# Patient Record
Sex: Male | Born: 1990 | Race: Black or African American | Hispanic: No | Marital: Single | Smoking: Current every day smoker
Health system: Southern US, Community
[De-identification: ages and names within clinical notes are randomized; demographics above are authoritative.]

---
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
## 2016-06-22 ENCOUNTER — Ambulatory Visit (HOSPITAL_COMMUNITY)
Admission: EM | Admit: 2016-06-22 | Discharge: 2016-06-22 | Disposition: A | Discharge status: Home / Self Care | Payer: Self-pay | Attending: Internal Medicine | Admitting: Internal Medicine

## 2016-06-22 ENCOUNTER — Encounter (HOSPITAL_COMMUNITY): Payer: Self-pay | Admitting: *Deleted

## 2016-06-22 DIAGNOSIS — K640 First degree hemorrhoids: Secondary | ICD-10-CM

## 2016-06-22 MED ORDER — HYDROCORTISONE 2.5 % RE CREA: TOPICAL_CREAM | RECTAL | 1 refills | Status: DC

## 2016-06-22 MED ORDER — HYDROCORTISONE ACETATE 25 MG RE SUPP: 25.0000 mg | Freq: Two times a day (BID) | RECTAL | 0 refills | Status: DC

## 2016-06-22 MED ORDER — LIDOCAINE-HYDROCORTISONE ACE 3-0.5 % RE CREA: 1.0000 | TOPICAL_CREAM | Freq: Two times a day (BID) | RECTAL | 3 refills | Status: DC

## 2016-06-22 MED ORDER — HYDROCORTISONE ACETATE 25 MG RE SUPP: 25.0000 mg | Freq: Two times a day (BID) | RECTAL | 1 refills | Status: DC

## 2016-06-22 NOTE — ED Triage Notes (Signed)
Pt  Reports  Rectal  Pain  With  Some  Bleeding  No  History  Of  hemmorhiods     Pt  Reports  Pain   And  Pressure  On palpation  And  When  He  Sits

## 2016-06-22 NOTE — ED Notes (Addendum)
Pts mother called stating he anusol suppositories and cream was too expensive and wanted something different. Said we should of known he didn't have insurance. Per bill he could use prep H. suppositories otc and I did find that [facility] has the ointment for 9.99 and I relayed to the pt and bill and [facility]. Pts mother understood but was still verbally upset

## 2016-06-22 NOTE — ED Triage Notes (Signed)
Pt Reports

## 2016-06-22 NOTE — ED Provider Notes (Signed)
CSN: [id number]     Arrival date & time 06/22/16  1812 History   First MD Initiated Contact with Patient 06/22/16 1840     Chief Complaint  Patient presents with  . Rectal Bleeding   (Consider location/radiation/quality/duration/timing/severity/associated sxs/prior Treatment) Patient c/o rectal discomfort and bleeding.   The history is provided by the patient.  Rectal Bleeding  Quality:  Bright red Amount:  Scant Duration:  1 day Timing:  Constant Chronicity:  New   History reviewed. No pertinent past medical history. History reviewed. No pertinent surgical history. History reviewed. No pertinent family history. Social History  Substance Use Topics  . Smoking status: Current Every Day Smoker  . Smokeless tobacco: Never Used  . Alcohol use Yes    Review of Systems  Constitutional: Negative.   HENT: Negative.   Eyes: Negative.   Respiratory: Negative.   Cardiovascular: Negative.   Gastrointestinal: Positive for hematochezia.  Endocrine: Negative.   Genitourinary: Negative.   Musculoskeletal: Negative.   Allergic/Immunologic: Negative.   Neurological: Negative.   Hematological: Negative.   Psychiatric/Behavioral: Negative.     Allergies  Patient has no known allergies.  Home Medications   Prior to Admission medications   Medication Sig Start Date End Date Taking? Authorizing Provider  hydrocortisone (ANUSOL-HC) 2.5 % rectal cream Apply rectally 2 times daily 06/22/16   Deatra Canterxford, [place]set Joshi J, FNP  hydrocortisone (ANUSOL-HC) 25 MG suppository Place 1 suppository (25 mg total) rectally 2 (two) times daily. 06/22/16   Deatra Canterxford, Yosef Krogh J, FNP   Meds Ordered and Administered this Visit  Medications - No data to display  BP 130/72 (BP Location: Right Arm)   Pulse 78   Temp 98.6 F (37 C) (Oral)   Resp 18   SpO2 99%  No data found.   Physical Exam  Constitutional: He appears well-developed and well-nourished.  HENT:  Head: Normocephalic and atraumatic.  Eyes:  Conjunctivae and EOM are normal. Pupils are equal, round, and reactive to light.  Neck: Normal range of motion. Neck supple.  Cardiovascular: Normal rate, regular rhythm and normal heart sounds.   Pulmonary/Chest: Effort normal and breath sounds normal.  Abdominal: Soft. Bowel sounds are normal.  Genitourinary: Rectum normal.  Genitourinary Comments: Rectum with normal tone and no masses and positive hemocult stool.  Nursing note and vitals reviewed.   Urgent Care Course     Procedures (including critical care time)  Labs Review Labs Reviewed - No data to display  Imaging Review No results found.   Visual Acuity Review  Right Eye Distance:   Left Eye Distance:   Bilateral Distance:    Right Eye Near:   Left Eye Near:    Bilateral Near:         MDM   1. Grade I hemorrhoids    anusol suppository bid anusol cream  Sits bath     Deatra CanterOxford, Cheridan Kibler J, [place]NP 06/22/16 1904

## 2016-08-02 ENCOUNTER — Encounter (HOSPITAL_COMMUNITY): Payer: Self-pay | Admitting: Emergency Medicine

## 2016-08-02 ENCOUNTER — Emergency Department (HOSPITAL_COMMUNITY)
Admission: EM | Admit: 2016-08-02 | Discharge: 2016-08-02 | Disposition: A | Discharge status: Home / Self Care | Payer: Self-pay | Attending: Emergency Medicine | Admitting: Emergency Medicine

## 2016-08-02 DIAGNOSIS — F17210 Nicotine dependence, cigarettes, uncomplicated: Secondary | ICD-10-CM | POA: Insufficient documentation

## 2016-08-02 DIAGNOSIS — R112 Nausea with vomiting, unspecified: Secondary | ICD-10-CM | POA: Insufficient documentation

## 2016-08-02 DIAGNOSIS — R10817 Generalized abdominal tenderness: Secondary | ICD-10-CM | POA: Insufficient documentation

## 2016-08-02 LAB — COMPREHENSIVE METABOLIC PANEL
ALBUMIN: 4.3 g/dL (ref 3.5–5.0)
ALT: 24 U/L (ref 17–63)
AST: 29 U/L (ref 15–41)
Alkaline Phosphatase: 78 U/L (ref 38–126)
Anion gap: 13 (ref 5–15)
BUN: 15 mg/dL (ref 6–20)
CO2: 23 mmol/L (ref 22–32)
CREATININE: 1.4 mg/dL — AB (ref 0.61–1.24)
Calcium: 9.5 mg/dL (ref 8.9–10.3)
Chloride: 105 mmol/L (ref 101–111)
GFR calc Af Amer: >60 mL/min (ref >60)
GFR calc non Af Amer: >60 mL/min (ref >60)
GLUCOSE: 103 mg/dL — AB (ref 65–99)
Potassium: 3.8 mmol/L (ref 3.5–5.1)
SODIUM: 141 mmol/L (ref 135–145)
Total Bilirubin: 0.5 mg/dL (ref 0.3–1.2)
Total Protein: 8 g/dL (ref 6.5–8.1)

## 2016-08-02 LAB — CBC WITH DIFFERENTIAL/PLATELET
BASOS ABS: 0 10*3/uL (ref 0.0–0.1)
Basophils Relative: 0 %
EOS PCT: 0 %
Eosinophils Absolute: 0 10*3/uL (ref 0.0–0.7)
HEMATOCRIT: 41.7 % (ref 39.0–52.0)
Hemoglobin: 14.5 g/dL (ref 13.0–17.0)
LYMPHS PCT: 11 %
Lymphs Abs: 1.5 10*3/uL (ref 0.7–4.0)
MCH: 30.5 pg (ref 26.0–34.0)
MCHC: 34.8 g/dL (ref 30.0–36.0)
MCV: 87.6 fL (ref 78.0–100.0)
MONOS PCT: 4 %
Monocytes Absolute: 0.5 10*3/uL (ref 0.1–1.0)
NEUTROS ABS: 11.6 10*3/uL — AB (ref 1.7–7.7)
Neutrophils Relative %: 85 %
Platelets: 256 10*3/uL (ref 150–400)
RBC: 4.76 MIL/uL (ref 4.22–5.81)
RDW: 13.6 % (ref 11.5–15.5)
WBC: 13.6 10*3/uL — ABNORMAL HIGH (ref 4.0–10.5)

## 2016-08-02 LAB — LIPASE, BLOOD: Lipase: 27 U/L (ref 11–51)

## 2016-08-02 MED ORDER — SODIUM CHLORIDE 0.9 % IV BOLUS (SEPSIS)
1000.0000 mL | Freq: Once | INTRAVENOUS | Status: AC
  Administered 2016-08-02: 1000 mL via INTRAVENOUS

## 2016-08-02 MED ORDER — ONDANSETRON HCL 4 MG/2ML IJ SOLN
4.0000 mg | Freq: Once | INTRAMUSCULAR | Status: AC
  Administered 2016-08-02: 4 mg via INTRAVENOUS
  Filled 2016-08-02: qty 2

## 2016-08-02 MED ORDER — ONDANSETRON 4 MG PO TBDP: ORAL_TABLET | ORAL | 0 refills | Status: DC

## 2016-08-02 MED ORDER — GI COCKTAIL ~~LOC~~
30.0000 mL | Freq: Once | ORAL | Status: AC
  Administered 2016-08-02: 30 mL via ORAL
  Filled 2016-08-02: qty 30

## 2016-08-02 NOTE — ED Notes (Signed)
Discharge instructions reviewed with patient. Patient verbalizes understanding. VSS.   

## 2016-08-02 NOTE — Discharge Instructions (Signed)
Return for pinpoint abdominal pain.  Fever, inability to eat or drink.  Try zantac 150mg  twice a day.

## 2016-08-02 NOTE — ED Provider Notes (Signed)
[facility]-EMERGENCY DEPT Provider Note   CSN: [id number] Arrival date & time: 08/02/16  1155     History   Chief Complaint Chief Complaint  Patient presents with  . Emesis  . ETOH use    HPI Robert Giles is a 26 y.o. male.  26 yo M with a chief complaint of nausea and vomiting. Started this morning. Patient went out drinking fairly heavily last night. Having some subjective fevers and chills. Denies diarrhea. Denies sick contacts. Diffuse abdominal pain.   The history is provided by the patient.  Emesis   Associated symptoms include abdominal pain. Pertinent negatives include no arthralgias, no chills, no diarrhea, no fever, no headaches and no myalgias.  Illness  This is a new problem. The current episode started 3 to 5 hours ago. The problem occurs constantly. The problem has not changed since onset.Associated symptoms include abdominal pain. Pertinent negatives include no chest pain, no headaches and no shortness of breath. Nothing aggravates the symptoms. Nothing relieves the symptoms. He has tried nothing for the symptoms. The treatment provided no relief.    History reviewed. No pertinent past medical history.  There are no active problems to display for this patient.   History reviewed. No pertinent surgical history.     Home Medications    Prior to Admission medications   Medication Sig Start Date End Date Taking? Authorizing Provider  hydrocortisone (ANUSOL-HC) 2.5 % rectal cream Apply rectally 2 times daily Patient not taking: Reported on 08/02/2016 06/22/16   Deatra Canterxford, William J, FNP  hydrocortisone (ANUSOL-HC) 25 MG suppository Place 1 suppository (25 mg total) rectally 2 (two) times daily. Patient not taking: Reported on 08/02/2016 06/22/16   Deatra Canterxford, William J, FNP  hydrocortisone (ANUSOL-HC) 25 MG suppository Place 1 suppository (25 mg total) rectally 2 (two) times daily. Patient not taking: Reported on 08/02/2016 06/22/16   Deatra Canterxford, William J, FNP    lidocaine-hydrocortisone (ANAMANTEL HC) 3-0.5 % CREA Place 1 Applicatorful rectally 2 (two) times daily. Patient not taking: Reported on 08/02/2016 06/22/16   Deatra Canterxford, William J, FNP  ondansetron (ZOFRAN ODT) 4 MG disintegrating tablet 4mg  ODT q4 hours prn nausea/vomit 08/02/16   Melene PlanFloyd, Royale Swamy, DO    Family History No family history on file.  Social History Social History  Substance Use Topics  . Smoking status: Current Every Day Smoker  . Smokeless tobacco: Never Used  . Alcohol use Yes     Allergies   Patient has no known allergies.   Review of Systems Review of Systems  Constitutional: Negative for chills and fever.  HENT: Negative for congestion and facial swelling.   Eyes: Negative for discharge and visual disturbance.  Respiratory: Negative for shortness of breath.   Cardiovascular: Negative for chest pain and palpitations.  Gastrointestinal: Positive for abdominal pain, nausea and vomiting. Negative for diarrhea.  Musculoskeletal: Negative for arthralgias and myalgias.  Skin: Negative for color change and rash.  Neurological: Negative for tremors, syncope and headaches.  Psychiatric/Behavioral: Negative for confusion and dysphoric mood.     Physical Exam Updated Vital Signs BP 107/64   Pulse 78   Temp 98.4 F (36.9 C) (Oral)   Resp (!) 22   Ht 5\' 11"  (1.803 m)   Wt 95.3 kg (210 lb)   SpO2 100%   BMI 29.29 kg/m   Physical Exam  Constitutional: He is oriented to person, place, and time. He appears well-developed and well-nourished.  HENT:  Head: Normocephalic and atraumatic.  Eyes: Pupils are equal, round, and reactive to  light. EOM are normal.  Neck: Normal range of motion. Neck supple. No JVD present.  Cardiovascular: Normal rate and regular rhythm.  Exam reveals no gallop and no friction rub.   No murmur heard. Pulmonary/Chest: No respiratory distress. He has no wheezes.  Abdominal: He exhibits no distension and no mass. There is tenderness (mild diffuse).  There is no rebound and no guarding.  Musculoskeletal: Normal range of motion.  Neurological: He is alert and oriented to person, place, and time.  Skin: No rash noted. No pallor.  Psychiatric: He has a normal mood and affect. His behavior is normal.  Nursing note and vitals reviewed.    ED Treatments / Results  Labs (all labs ordered are listed, but only abnormal results are displayed) Labs Reviewed  CBC WITH DIFFERENTIAL/PLATELET - Abnormal; Notable for the following:       Result Value   WBC 13.6 (*)    Neutro Abs 11.6 (*)    All other components within normal limits  COMPREHENSIVE METABOLIC PANEL - Abnormal; Notable for the following:    Glucose, Bld 103 (*)    Creatinine, Ser 1.40 (*)    All other components within normal limits  LIPASE, BLOOD    EKG  EKG Interpretation None       Radiology No results found.  Procedures Procedures (including critical care time)  Medications Ordered in ED Medications  gi cocktail (Maalox,Lidocaine,Donnatal) (not administered)  sodium chloride 0.9 % bolus 1,000 mL (1,000 mLs Intravenous New Bag/Given 08/02/16 1232)  ondansetron (ZOFRAN) injection 4 mg (4 mg Intravenous Given 08/02/16 1232)     Initial Impression / Assessment and Plan / ED Course  I have reviewed the triage vital signs and the nursing notes.  Pertinent labs & imaging results that were available during my care of the patient were reviewed by me and considered in my medical decision making (see chart for details).     26 yo M with a cc of nausea and vomiting after drinking last night.  No focal abdominal tenderness, labs with mild leukocytosis, creatine with mild elevation though large muscle mass.  Given zofran with improvement. D/c home.  PCP follow up.   1:20 PM:  I have discussed the diagnosis/risks/treatment options with the patient and family and believe the pt to be eligible for discharge home to follow-up with PCP. We also discussed returning to the ED  immediately if new or worsening sx occur. We discussed the sx which are most concerning (e.g., sudden worsening pain, fever, inability to tolerate by mouth) that necessitate immediate return. Medications administered to the patient during their visit and any new prescriptions provided to the patient are listed below.  Medications given during this visit Medications  gi cocktail (Maalox,Lidocaine,Donnatal) (not administered)  sodium chloride 0.9 % bolus 1,000 mL (1,000 mLs Intravenous New Bag/Given 08/02/16 1232)  ondansetron (ZOFRAN) injection 4 mg (4 mg Intravenous Given 08/02/16 1232)     The patient appears reasonably screen and/or stabilized for discharge and I doubt any other medical condition or other [facility] requiring further screening, evaluation, or treatment in the ED at this time prior to discharge.    Final Clinical Impressions(s) / ED Diagnoses   Final diagnoses:  Nausea and vomiting in adult    New Prescriptions New Prescriptions   ONDANSETRON (ZOFRAN ODT) 4 MG DISINTEGRATING TABLET    4mg  ODT q4 hours prn nausea/vomit     Melene PlanFloyd, Dorsey Authement, DO 08/02/16 1320

## 2016-08-02 NOTE — ED Triage Notes (Signed)
Pt believes he has alcohol poisoning related to n/v/d post ETOH use last night.

## 2020-11-28 ENCOUNTER — Ambulatory Visit (HOSPITAL_COMMUNITY)
Admission: EM | Admit: 2020-11-28 | Discharge: 2020-11-28 | Disposition: A | Discharge status: Home / Self Care | Payer: Self-pay | Attending: Urgent Care | Admitting: Urgent Care

## 2020-11-28 ENCOUNTER — Encounter (HOSPITAL_COMMUNITY): Payer: Self-pay

## 2020-11-28 ENCOUNTER — Other Ambulatory Visit: Payer: Self-pay

## 2020-11-28 DIAGNOSIS — Z23 Encounter for immunization: Secondary | ICD-10-CM

## 2020-11-28 DIAGNOSIS — S51811A Laceration without foreign body of right forearm, initial encounter: Secondary | ICD-10-CM

## 2020-11-28 DIAGNOSIS — M79631 Pain in right forearm: Secondary | ICD-10-CM

## 2020-11-28 MED ORDER — NAPROXEN 500 MG PO TABS: 500.0000 mg | ORAL_TABLET | Freq: Two times a day (BID) | ORAL | 0 refills | Status: DC

## 2020-11-28 MED ORDER — TETANUS-DIPHTH-ACELL PERTUSSIS 5-2.5-18.5 LF-MCG/0.5 IM SUSY
0.5000 mL | PREFILLED_SYRINGE | Freq: Once | INTRAMUSCULAR | Status: AC
  Administered 2020-11-28: 0.5 mL via INTRAMUSCULAR

## 2020-11-28 MED ORDER — TETANUS-DIPHTH-ACELL PERTUSSIS 5-2.5-18.5 LF-MCG/0.5 IM SUSY
PREFILLED_SYRINGE | INTRAMUSCULAR | Status: AC
  Filled 2020-11-28: qty 0.5

## 2020-11-28 NOTE — ED Provider Notes (Signed)
[facility]   MRN: [id number] DOB: 05-15-1990  Subjective:   Robert Giles is a 30 y.o. male presenting for suffering a right forearm laceration ~24 hours ago. Needs tdap updated. Has kept the wound clean and covered.   No current facility-administered medications for this encounter.  Current Outpatient Medications:    naproxen (NAPROSYN) 500 MG tablet, Take 1 tablet (500 mg total) by mouth 2 (two) times daily with a meal., Disp: 30 tablet, Rfl: 0   hydrocortisone (ANUSOL-HC) 2.5 % rectal cream, Apply rectally 2 times daily (Patient not taking: Reported on 08/02/2016), Disp: 28.3 g, Rfl: 1   hydrocortisone (ANUSOL-HC) 25 MG suppository, Place 1 suppository (25 mg total) rectally 2 (two) times daily. (Patient not taking: Reported on 08/02/2016), Disp: 12 suppository, Rfl: 1   hydrocortisone (ANUSOL-HC) 25 MG suppository, Place 1 suppository (25 mg total) rectally 2 (two) times daily. (Patient not taking: Reported on 08/02/2016), Disp: 12 suppository, Rfl: 0   lidocaine-hydrocortisone (ANAMANTEL HC) 3-0.5 % CREA, Place 1 Applicatorful rectally 2 (two) times daily. (Patient not taking: Reported on 08/02/2016), Disp: 28.3 g, Rfl: 3   ondansetron (ZOFRAN ODT) 4 MG disintegrating tablet, 4mg  ODT q4 hours prn nausea/vomit, Disp: 20 tablet, Rfl: 0   No Known Allergies  History reviewed. No pertinent past medical history.   History reviewed. No pertinent surgical history.  History reviewed. No pertinent family history.  Social History   Tobacco Use   Smoking status: Every Day   Smokeless tobacco: Never  Substance Use Topics   Alcohol use: Yes   Drug use: Yes    Types: Marijuana    ROS   Objective:   Vitals: BP 123/86 (BP Location: Right Arm)   Pulse 84   Temp 98.1 F (36.7 C) (Oral)   Resp 19   SpO2 97%   Physical Exam Constitutional:      General: He is not in acute distress.    Appearance: Normal appearance. He is well-developed and normal weight. He  is not ill-appearing, toxic-appearing or diaphoretic.  HENT:     Head: Normocephalic and atraumatic.     Right Ear: External ear normal.     Left Ear: External ear normal.     Nose: Nose normal.     Mouth/Throat:     Pharynx: Oropharynx is clear.  Eyes:     General: No scleral icterus.       Right eye: No discharge.        Left eye: No discharge.     Extraocular Movements: Extraocular movements intact.     Pupils: Pupils are equal, round, and reactive to light.  Cardiovascular:     Rate and Rhythm: Normal rate.  Pulmonary:     Effort: Pulmonary effort is normal.  Musculoskeletal:       Arms:     Cervical back: Normal range of motion.  Neurological:     Mental Status: He is alert and oriented to person, place, and time.  Psychiatric:        Mood and Affect: Mood normal.        Behavior: Behavior normal.        Thought Content: Thought content normal.        Judgment: Judgment normal.     Assessment and Plan :   PDMP not reviewed this encounter.  1. Pain of right forearm   2. Laceration of right forearm, initial encounter   3. Need for Tdap vaccination    Offered 1-2  sutures for loose approximation counseling on the risk for infection given age of the wound.  Patient declined.  Tdap updated.  Wound care reviewed.  Naproxen for pain and inflammation. Counseled patient on potential for adverse effects with medications prescribed/recommended today, ER and return-to-clinic precautions discussed, patient verbalized understanding.    Wallis Bamberg, [place] 11/28/20 1947

## 2020-11-28 NOTE — Discharge Instructions (Signed)
Change your dressing 2-3 times daily. Wash the wound every time you change your dressing. Use Dial antibacterial soap, warm soapy water. Do not use ointments or creams. If the gauze sticks to your wound, then simply get it wet with the warm soapy water and it'll come off easily. You can use non-stick gauze to help prevent the gauze from sticking to your skin. If you develop redness, drainage of pus, fever, those are signs of infection and we need to see you back for that. In the meantime, you can use naproxen twice a day with food for pain.

## 2020-11-28 NOTE — ED Triage Notes (Signed)
PT called from front lobby with no answer 

## 2020-11-28 NOTE — ED Triage Notes (Signed)
Pt reports he has a cut in the right arm x 1 day. States he cut right arm with a wood door.

## 2020-12-29 ENCOUNTER — Encounter (HOSPITAL_COMMUNITY): Payer: Self-pay | Admitting: Emergency Medicine

## 2020-12-29 ENCOUNTER — Ambulatory Visit (HOSPITAL_COMMUNITY)
Admission: EM | Admit: 2020-12-29 | Discharge: 2020-12-29 | Disposition: A | Discharge status: Home / Self Care | Payer: Self-pay | Attending: Family Medicine | Admitting: Family Medicine

## 2020-12-29 ENCOUNTER — Other Ambulatory Visit: Payer: Self-pay

## 2020-12-29 DIAGNOSIS — T792XXA Traumatic secondary and recurrent hemorrhage and seroma, initial encounter: Secondary | ICD-10-CM

## 2020-12-29 NOTE — ED Triage Notes (Signed)
Pt presents with right arm pain. States was seen on 11/21 for wound on right arm. States over the last two weeks has developed swelling and fluid under skin.

## 2020-12-29 NOTE — ED Provider Notes (Signed)
°  [facility]   [phone] 12/29/20 Arrival Time: 1307  ASSESSMENT & PLAN:  1. Seroma due to trauma [facility])    Incision and Drainage Procedure Note  Anesthesia: PainEaze spray  Procedure Details  The procedure, risks and complications have been discussed in detail (including, but not limited to pain and bleeding) with the patient.  The skin induration was prepped and draped in the usual fashion. Serosanguinous fluid aspirated from swelling around healed wound of R forearm.  EBL: minimal Drains: none Packing: n/a Condition: Tolerated procedure well Complications: none.  Finish all antibiotics. OTC analgesics as needed.  Reviewed expectations re: course of current medical issues. Questions answered. Outlined signs and symptoms indicating need for more acute intervention. Patient verbalized understanding. After Visit Summary given.   SUBJECTIVE:  Robert Giles is a 30 y.o. male who presents with swelling around healed wound of L forearm; x 2 weeks. No drainage or bleeding.  No overlying erythema. Afebrile. No extremity sensation changes or weakness.    OBJECTIVE:  Vitals:   12/29/20 1337  BP: (!) 137/93  Pulse: 79  Resp: 17  Temp: 98.5 F (36.9 C)  TempSrc: Oral  SpO2: 99%     General appearance: alert; no distress R forearm: approx 1.5 x 2 cm induration of his R forearm; soft; non-tender to touch; no active drainage or bleeding Psychological: alert and cooperative; normal mood and affect  No Known Allergies  History reviewed. No pertinent past medical history. Social History   Socioeconomic History   Marital status: Single    Spouse name: Not on file   Number of children: Not on file   Years of education: Not on file   Highest education level: Not on file  Occupational History   Not on file  Tobacco Use   Smoking status: Every Day   Smokeless tobacco: Never  Substance and Sexual Activity   Alcohol use: Yes   Drug use: Yes    Types:  Marijuana   Sexual activity: Yes  Other Topics Concern   Not on file  Social History Narrative   Not on file   Social Determinants of Health   Financial Resource Strain: Not on file  Food Insecurity: Not on file  Transportation Needs: Not on file  Physical Activity: Not on file  Stress: Not on file  Social Connections: Not on file   History reviewed. No pertinent family history. History reviewed. No pertinent surgical history.          Mardella Layman, MD 12/29/20 1409

## 2021-03-08 ENCOUNTER — Emergency Department (HOSPITAL_COMMUNITY)
Admission: EM | Admit: 2021-03-08 | Discharge: 2021-03-08 | Disposition: A | Discharge status: Home / Self Care | Payer: BC Managed Care – PPO | Attending: Emergency Medicine | Admitting: Emergency Medicine

## 2021-03-08 ENCOUNTER — Other Ambulatory Visit: Payer: Self-pay

## 2021-03-08 ENCOUNTER — Encounter (HOSPITAL_COMMUNITY): Payer: Self-pay | Admitting: Emergency Medicine

## 2021-03-08 DIAGNOSIS — Z202 Contact with and (suspected) exposure to infections with a predominantly sexual mode of transmission: Secondary | ICD-10-CM

## 2021-03-08 DIAGNOSIS — Z5321 Procedure and treatment not carried out due to patient leaving prior to being seen by health care provider: Secondary | ICD-10-CM | POA: Diagnosis not present

## 2021-03-08 DIAGNOSIS — Z118 Encounter for screening for other infectious and parasitic diseases: Secondary | ICD-10-CM | POA: Diagnosis not present

## 2021-03-08 NOTE — ED Triage Notes (Signed)
Patient's sexual partner tested positive for chlamydia, has not been with her for about 3 weeks. Denies any urinary symptoms, discharge or pain.  ?

## 2021-03-13 ENCOUNTER — Encounter (HOSPITAL_COMMUNITY): Payer: Self-pay

## 2021-03-13 ENCOUNTER — Emergency Department (HOSPITAL_COMMUNITY)
Admission: EM | Admit: 2021-03-13 | Discharge: 2021-03-13 | Disposition: A | Discharge status: Home / Self Care | Payer: BC Managed Care – PPO | Attending: Emergency Medicine | Admitting: Emergency Medicine

## 2021-03-13 ENCOUNTER — Other Ambulatory Visit: Payer: Self-pay

## 2021-03-13 DIAGNOSIS — Z202 Contact with and (suspected) exposure to infections with a predominantly sexual mode of transmission: Secondary | ICD-10-CM | POA: Insufficient documentation

## 2021-03-13 LAB — URINALYSIS, ROUTINE W REFLEX MICROSCOPIC
Bilirubin Urine: NEGATIVE
Glucose, UA: NEGATIVE mg/dL
Hgb urine dipstick: NEGATIVE
Ketones, ur: NEGATIVE mg/dL
Leukocytes,Ua: NEGATIVE
Nitrite: NEGATIVE
Protein, ur: NEGATIVE mg/dL
Specific Gravity, Urine: 1.017 (ref 1.005–1.030)
pH: 5 (ref 5.0–8.0)

## 2021-03-13 MED ORDER — LIDOCAINE HCL 1 % IJ SOLN
INTRAMUSCULAR | Status: AC
  Filled 2021-03-13: qty 20

## 2021-03-13 MED ORDER — CEFTRIAXONE SODIUM 1 G IJ SOLR
500.0000 mg | Freq: Once | INTRAMUSCULAR | Status: AC
  Administered 2021-03-13: 500 mg via INTRAMUSCULAR
  Filled 2021-03-13: qty 10

## 2021-03-13 MED ORDER — DOXYCYCLINE HYCLATE 100 MG PO CAPS: 100.0000 mg | ORAL_CAPSULE | Freq: Two times a day (BID) | ORAL | 0 refills | Status: DC

## 2021-03-13 NOTE — Discharge Instructions (Addendum)
You were treated empiraccly for gonorrhea while in the ED today. The urine takes 3 days to grow out chylamydia. Check my chart for results. If you are positive take doxycyline twice dialy for 10 days. Avoid unprotected sex until STD free   ?

## 2021-03-13 NOTE — ED Provider Notes (Signed)
?[facility]-EMERGENCY DEPT ?Provider Note ? ? ?CSN: [id number] ?Arrival date & time: 03/13/21  1352 ? ?  ? ?History ? ?Chief Complaint  ?Patient presents with  ? Exposure to STD  ? ? ?DIMETRIUS Giles is a 31 y.o. male. ? ? ?Exposure to STD ? ? ?Patient presents due to exposure to chlamydia.  3 weeks ago he had unprotected sex with a male who tested positive for chlamydia recently.  Patient himself is asymptomatic, denies any penile discharge, rash, dysuria, hematuria, testicular pain or swelling. ? ?Home Medications ?Prior to Admission medications   ?Medication Sig Start Date End Date Taking? Authorizing Provider  ?doxycycline (VIBRAMYCIN) 100 MG capsule Take 1 capsule (100 mg total) by mouth 2 (two) times daily. 03/13/21  Yes Theron Arista, PA-C  ?naproxen (NAPROSYN) 500 MG tablet Take 1 tablet (500 mg total) by mouth 2 (two) times daily with a meal. 11/28/20   Wallis Bamberg, PA-C  ?ondansetron (ZOFRAN ODT) 4 MG disintegrating tablet 4mg  ODT q4 hours prn nausea/vomit 08/02/16   08/04/16, DO  ?   ? ?Allergies    ?Patient has no known allergies.   ? ?Review of Systems   ?Review of Systems ? ?Physical Exam ?Updated Vital Signs ?BP (!) 146/109 (BP Location: Left Arm)   Pulse 86   Temp 98 ?F (36.7 ?C) (Oral)   Resp 18   Ht 5\' 11"  (1.803 m)   Wt 95 kg   SpO2 98%   BMI 29.21 kg/m?  ?Physical Exam ?Vitals and nursing note reviewed. Exam conducted with a chaperone present.  ?Constitutional:   ?   General: He is not in acute distress. ?   Appearance: Normal appearance.  ?HENT:  ?   Head: Normocephalic and atraumatic.  ?Eyes:  ?   General: No scleral icterus. ?   Extraocular Movements: Extraocular movements intact.  ?   Pupils: Pupils are equal, round, and reactive to light.  ?Skin: ?   Coloration: Skin is not jaundiced.  ?Neurological:  ?   Mental Status: He is alert. Mental status is at baseline.  ?   Coordination: Coordination normal.  ? ? ?ED Results / Procedures / Treatments   ?Labs ?(all labs  ordered are listed, but only abnormal results are displayed) ?Labs Reviewed  ?URINALYSIS, ROUTINE W REFLEX MICROSCOPIC  ?GC/CHLAMYDIA PROBE AMP ([place]) NOT AT [facility]  ? ? ?EKG ?None ? ?Radiology ?No results found. ? ?Procedures ?Procedures  ? ? ?Medications Ordered in ED ?Medications  ?cefTRIAXone (ROCEPHIN) injection 500 mg (500 mg Intramuscular Given 03/13/21 1423)  ? ? ?ED Course/ Medical Decision Making/ A&P ?  ?                        ?Medical Decision Making ?Amount and/or Complexity of Data Reviewed ?Labs: ordered. ? ?Risk ?Prescription drug management. ? ? ?31 year old male presenting due to STD exposure.  He is asymptomatic, vitals are stable and I have a low suspicion this could be PID, UTI, testicular torsion, emergent etiology.  UA collected as well as GC chlamydia testing.  We will treat empirically and discharged with doxycycline.  Advised patient to follow-up with health clinic in future for STD testing.  Discharged in stable condition. ? ? ? ? ? ? ? ?Final Clinical Impression(s) / ED Diagnoses ?Final diagnoses:  ?STD exposure  ? ? ?Rx / DC Orders ?ED Discharge Orders   ? ?      Ordered  ?  doxycycline (VIBRAMYCIN)  100 MG capsule  2 times daily       ? 03/13/21 1404  ? ?  ?  ? ?  ? ? ?  ?Theron Arista, PA-C ?03/13/21 2144 ? ?  ?Franne Forts, DO ?03/15/21 [phone] ? ?

## 2021-03-13 NOTE — ED Triage Notes (Signed)
Patient states he was told that a sexual partner who had chlamydia. Patient states no symptoms at this time. Patient states he has had no symptoms and sexual encounter happened  3 weeks ago.  ?

## 2021-04-04 ENCOUNTER — Other Ambulatory Visit: Payer: Self-pay

## 2021-04-04 ENCOUNTER — Other Ambulatory Visit (HOSPITAL_COMMUNITY)
Admission: RE | Admit: 2021-04-04 | Discharge: 2021-04-04 | Disposition: A | Discharge status: Home / Self Care | Payer: BC Managed Care – PPO | Source: Ambulatory Visit | Attending: Infectious Disease | Admitting: Infectious Disease

## 2021-04-04 ENCOUNTER — Ambulatory Visit (INDEPENDENT_AMBULATORY_CARE_PROVIDER_SITE_OTHER): Payer: BC Managed Care – PPO | Admitting: Pharmacist

## 2021-04-04 DIAGNOSIS — A64 Unspecified sexually transmitted disease: Secondary | ICD-10-CM | POA: Diagnosis not present

## 2021-04-04 MED ORDER — DOXYCYCLINE HYCLATE 100 MG PO TABS: 100.0000 mg | ORAL_TABLET | Freq: Two times a day (BID) | ORAL | 0 refills | Status: DC

## 2021-04-04 NOTE — Progress Notes (Signed)
? ?  04/04/2021 ? ?HPI: Robert Giles is a 31 y.o. male who presents to the [facility] clinic today for STI testing. ? ?There are no problems to display for this patient. ? ? ?Patient's Medications  ?New Prescriptions  ? DOXYCYCLINE (VIBRA-TABS) 100 MG TABLET    Take 1 tablet (100 mg total) by mouth 2 (two) times daily.  ?Previous Medications  ? DOXYCYCLINE (VIBRAMYCIN) 100 MG CAPSULE    Take 1 capsule (100 mg total) by mouth 2 (two) times daily.  ? NAPROXEN (NAPROSYN) 500 MG TABLET    Take 1 tablet (500 mg total) by mouth 2 (two) times daily with a meal.  ? ONDANSETRON (ZOFRAN ODT) 4 MG DISINTEGRATING TABLET    4mg  ODT q4 hours prn nausea/vomit  ?Modified Medications  ? No medications on file  ?Discontinued Medications  ? No medications on file  ? ? ?Allergies: ?No Known Allergies ? ?Past Medical History: ?No past medical history on file. ? ?Social History: ?Social History  ? ?Socioeconomic History  ? Marital status: Single  ?  Spouse name: Not on file  ? Number of children: Not on file  ? Years of education: Not on file  ? Highest education level: Not on file  ?Occupational History  ? Not on file  ?Tobacco Use  ? Smoking status: Former  ?  Types: Cigarettes  ? Smokeless tobacco: Never  ?Vaping Use  ? Vaping Use: Some days  ?Substance and Sexual Activity  ? Alcohol use: Yes  ? Drug use: Yes  ?  Types: Marijuana  ? Sexual activity: Yes  ?Other Topics Concern  ? Not on file  ?Social History Narrative  ? Not on file  ? ?Social Determinants of Health  ? ?Financial Resource Strain: Not on file  ?Food Insecurity: Not on file  ?Transportation Needs: Not on file  ?Physical Activity: Not on file  ?Stress: Not on file  ?Social Connections: Not on file  ? ? ? ?Assessment: ?Robert Giles presented to clinic today for STI testing. He recently got tested 3/6 in the ED following a conversation with his male partner that she had tested positive for Chlamydia. The ED empirically treated him with Ceftriaxone 500 mg IM x1 and Doxycycline 100  mg BID x10d. He did not have G/C results from his ED encounter. However, when discussing what brought Robert Giles into clinic today, he did not take his Doxycycline, expecting to have received a call stating he was positive. ? ?He denies any sign or symptoms of STI. He states his partner took her full course of treatment. He has not had any sexual encounters since 3/6. Counseled the patient on what empiric treatment is and why we start therapy before results are available. Additionally, collected both urine and oral G/C cytologies. Re-wrote his doxycycline for 100 mg BID x7d and sent to [facility]. Counseled patient on adherence and abstinence.  ? ?Plan: ?- STI screening: urine/pharyngeal GC/CT swabs for cytology today ?- Empiric treatment: Doxycycline for 100 mg BID x7d ?- F/u results to see if treatment needed ? ?Thank you for allowing pharmacy to be apart of this patient's care ? ?Ginnie Smart, PharmD Candidate ?

## 2021-04-05 LAB — URINE CYTOLOGY ANCILLARY ONLY
Chlamydia: NEGATIVE
Comment: NEGATIVE
Comment: NEGATIVE
Comment: NORMAL
Neisseria Gonorrhea: NEGATIVE
Trichomonas: NEGATIVE

## 2021-04-05 LAB — CYTOLOGY, (ORAL, ANAL, URETHRAL) ANCILLARY ONLY
Chlamydia: NEGATIVE
Comment: NEGATIVE
Comment: NEGATIVE
Comment: NORMAL
Neisseria Gonorrhea: NEGATIVE
Trichomonas: NEGATIVE

## 2021-04-19 ENCOUNTER — Emergency Department (HOSPITAL_COMMUNITY)
Admission: EM | Admit: 2021-04-19 | Discharge: 2021-04-19 | Disposition: A | Discharge status: Home / Self Care | Payer: BC Managed Care – PPO | Attending: Emergency Medicine | Admitting: Emergency Medicine

## 2021-04-19 ENCOUNTER — Encounter (HOSPITAL_COMMUNITY): Payer: Self-pay

## 2021-04-19 DIAGNOSIS — K6289 Other specified diseases of anus and rectum: Secondary | ICD-10-CM | POA: Diagnosis not present

## 2021-04-19 DIAGNOSIS — N410 Acute prostatitis: Secondary | ICD-10-CM | POA: Diagnosis not present

## 2021-04-19 LAB — URINALYSIS, ROUTINE W REFLEX MICROSCOPIC
Bilirubin Urine: NEGATIVE
Glucose, UA: NEGATIVE mg/dL
Hgb urine dipstick: NEGATIVE
Ketones, ur: 20 mg/dL — AB
Leukocytes,Ua: NEGATIVE
Nitrite: NEGATIVE
Protein, ur: NEGATIVE mg/dL
Specific Gravity, Urine: 1.024 (ref 1.005–1.030)
pH: 5 (ref 5.0–8.0)

## 2021-04-19 MED ORDER — TRAMADOL HCL 50 MG PO TABS: 50.0000 mg | ORAL_TABLET | Freq: Four times a day (QID) | ORAL | 0 refills | Status: DC | PRN

## 2021-04-19 MED ORDER — OXYCODONE-ACETAMINOPHEN 5-325 MG PO TABS
1.0000 | ORAL_TABLET | Freq: Once | ORAL | Status: AC
  Administered 2021-04-19: 1 via ORAL
  Filled 2021-04-19: qty 1

## 2021-04-19 MED ORDER — SULFAMETHOXAZOLE-TRIMETHOPRIM 800-160 MG PO TABS: 1.0000 | ORAL_TABLET | Freq: Two times a day (BID) | ORAL | 0 refills | Status: DC

## 2021-04-19 NOTE — Discharge Instructions (Addendum)
As we discussed, I suspect that your pain is due to acute prostatitis.  I have given you antibiotics and pain medication for management of this.  It is extremely important that you take all 28 days of your antibiotics.  Additionally, please do not drive or operate heavy machinery after taking narcotic pain medication.  Also please only take this for severe pain.  I have also given you a referral to urology with a number to call to schedule an appointment for further evaluation and management of your symptoms. ? ?Return if development of any new or worsening symptoms. ?

## 2021-04-19 NOTE — ED Triage Notes (Signed)
Pt reports abscess near his rectum between his buttock cheeks X3 and worse today.  ? ?10/10 pain   ? ?A/Ox4 ? ?

## 2021-04-19 NOTE — ED Provider Notes (Signed)
?[facility]-EMERGENCY DEPT ?Provider Note ? ? ?CSN: [id number] ?Arrival date & time: 04/19/21  1332 ? ?  ? ?History ? ?Chief Complaint  ?Patient presents with  ? Abscess  ?  Rectum   ? ? ?Robert Giles is a 31 y.o. male. ? ?Patient with no pertinent past medical history presents today with complaints of rectal pain.  He states that same began 4 days ago and has been progressively worsening since then.  States that today the pain is 10/10 in nature making it difficult for him to sit down.  He denies any history of rectal abscess or any symptoms similar to this before.  Does endorse some dysuria without hematuria.  States that he is able to have bowel movements with some pain.  Denies any purulent drainage or hematochezia.  States that the pain is located just inside his rectum.  Denies any flank pain or abdominal pain.  No fevers or chills. He also denies any penile discharge or penile pain. States that he was recently exposed to STDs, however was tested here and was negative.  Denies any MSM or any history of anal intercourse. ? ?The history is provided by the patient. No language interpreter was used.  ?Abscess ?Associated symptoms: no fever   ? ?  ? ?Home Medications ?Prior to Admission medications   ?Medication Sig Start Date End Date Taking? Authorizing Provider  ?doxycycline (VIBRA-TABS) 100 MG tablet Take 1 tablet (100 mg total) by mouth 2 (two) times daily. 04/04/21   Jennette Kettle, RPH-CPP  ?doxycycline (VIBRAMYCIN) 100 MG capsule Take 1 capsule (100 mg total) by mouth 2 (two) times daily. 03/13/21   Theron Arista, PA-C  ?naproxen (NAPROSYN) 500 MG tablet Take 1 tablet (500 mg total) by mouth 2 (two) times daily with a meal. 11/28/20   Wallis Bamberg, PA-C  ?ondansetron (ZOFRAN ODT) 4 MG disintegrating tablet 4mg  ODT q4 hours prn nausea/vomit 08/02/16   08/04/16, DO  ?   ? ?Allergies    ?Patient has no known allergies.   ? ?Review of Systems   ?Review of Systems  ?Constitutional:  Negative  for chills and fever.  ?Gastrointestinal:  Positive for rectal pain. Negative for constipation.  ?Genitourinary:  Negative for decreased urine volume, difficulty urinating, dysuria, penile discharge, penile pain, testicular pain and urgency.  ?All other systems reviewed and are negative. ? ?Physical Exam ?Updated Vital Signs ?BP 138/86   Pulse 87   Temp 98.7 ?F (37.1 ?C) (Oral)   Resp 18   Ht 5\' 11"  (1.803 m)   Wt 95 kg   SpO2 100%   BMI 29.21 kg/m?  ?Physical Exam ?Vitals and nursing note reviewed. Exam conducted with a chaperone present.  ?Constitutional:   ?   General: He is not in acute distress. ?   Appearance: Normal appearance. He is normal weight. He is not ill-appearing, toxic-appearing or diaphoretic.  ?HENT:  ?   Head: Normocephalic and atraumatic.  ?Cardiovascular:  ?   Rate and Rhythm: Normal rate.  ?Pulmonary:  ?   Effort: Pulmonary effort is normal. No respiratory distress.  ?Abdominal:  ?   General: Abdomen is flat.  ?   Palpations: Abdomen is soft.  ?   Tenderness: There is no abdominal tenderness. There is no right CVA tenderness or left CVA tenderness.  ?Genitourinary: ?   Comments: Boggy exquisitely tender prostate on rectal exam.  No abscess or other abnormality visualized on gluteal exam. ?Musculoskeletal:     ?  General: Normal range of motion.  ?   Cervical back: Normal range of motion.  ?Skin: ?   General: Skin is warm and dry.  ?Neurological:  ?   General: No focal deficit present.  ?   Mental Status: He is alert.  ?Psychiatric:     ?   Mood and Affect: Mood normal.     ?   Behavior: Behavior normal.  ? ? ?ED Results / Procedures / Treatments   ?Labs ?(all labs ordered are listed, but only abnormal results are displayed) ?Labs Reviewed  ?URINALYSIS, ROUTINE W REFLEX MICROSCOPIC - Abnormal; Notable for the following components:  ?    Result Value  ? Ketones, ur 20 (*)   ? All other components within normal limits  ?URINE CULTURE  ? ? ?EKG ?None ? ?Radiology ?No results  found. ? ?Procedures ?Procedures  ? ? ?Medications Ordered in ED ?Medications  ?oxyCODONE-acetaminophen (PERCOCET/ROXICET) 5-325 MG per tablet 1 tablet (1 tablet Oral Given 04/19/21 1836)  ? ? ?ED Course/ Medical Decision Making/ A&P ?  ?                        ?Medical Decision Making ?Amount and/or Complexity of Data Reviewed ?Labs: ordered. ? ?Risk ?Prescription drug management. ? ? ?Patient presents today with rectal pain that began 4 days ago.  Exam consistent with acute prostatitis.  He is afebrile, nontoxic-appearing, and in no acute distress with reassuring vital signs. UA noninfectious, culture pending. No signs of sepsis, no back pain, abdominal pain.  Was recently tested for STDs which was negative, denies any history of anal intercourse.  No indication for abdominal imaging or further laboratory evaluation at this time as his abdomen is soft and nontender and he has no CVA tenderness.  He is able to void and have bowel movements without difficulty.  Given lack of evidence for STD, will treat for acute prostatitis with 28 days of Bactrim with close urology follow-up and return precautions given.  We will also give few days of narcotic pain medication as patient is very tender in his rectal area.  Also recommend over-the-counter stool softeners to assist with bowel movements.  Patient understanding and amenable with plan, educated on red flag symptoms that would prompt immediate return.  Discharged in stable condition. ? ? ?This is a shared visit with supervising physician Dr. Renaye Rakers who has independently evaluated patient & provided guidance in evaluation/management/disposition, in agreement with care  ? ? ?Final Clinical Impression(s) / ED Diagnoses ?Final diagnoses:  ?Acute prostatitis  ? ? ?Rx / DC Orders ?ED Discharge Orders   ? ?      Ordered  ?  sulfamethoxazole-trimethoprim (BACTRIM DS) 800-160 MG tablet  2 times daily       ? 04/19/21 2201  ?  traMADol (ULTRAM) 50 MG tablet  Every 6 hours PRN        ? 04/19/21 2201  ? ?  ?  ? ?  ?An After Visit Summary was printed and given to the patient. ? ? ?  ?Silva Bandy, PA-C ?04/19/21 2212 ? ?  ?Terald Sleeper, MD ?04/20/21 0025 ? ?

## 2021-04-19 NOTE — ED Provider Triage Note (Signed)
Emergency Medicine Provider Triage Evaluation Note ? ?Ferrel Logan , a 31 y.o. male  was evaluated in triage.  Pt complains of gluteal abscess.  States that same has been present for 4 days and has been worsening.  Denies any history of abscesses in the past.  He is unsure if it has been draining.  States that he has been able to have normal bowel movements since onset, however states that he has been scared to have a bowel movement today with concern that it would cause significant pain.  States that his pain is currently 10/10.  Denies any abdominal pain, fevers, or chills. ? ?Review of Systems  ?Positive: ?Negative: See above ? ?Physical Exam  ?BP (!) 143/90 (BP Location: Left Arm)   Pulse (!) 111   Temp 98 ?F (36.7 ?C) (Oral)   Resp 18   SpO2 96%  ?Gen:   Awake, no distress   ?Resp:  Normal effort  ?MSK:   Moves extremities without difficulty  ?Other:  Unable to visualize abscess in triage ? ?Medical Decision Making  ?Medically screening exam initiated at 2:28 PM.  Appropriate orders placed.  FARHAAN MABEE was informed that the remainder of the evaluation will be completed by another provider, this initial triage assessment does not replace that evaluation, and the importance of remaining in the ED until their evaluation is complete. ? ? ?  ?Silva Bandy, PA-C ?04/19/21 1429 ? ?

## 2021-04-21 LAB — URINE CULTURE: Culture: NO GROWTH

## 2021-04-26 DIAGNOSIS — N179 Acute kidney failure, unspecified: Secondary | ICD-10-CM | POA: Diagnosis not present

## 2021-04-26 DIAGNOSIS — L02215 Cutaneous abscess of perineum: Secondary | ICD-10-CM | POA: Diagnosis not present

## 2021-04-26 DIAGNOSIS — E860 Dehydration: Secondary | ICD-10-CM | POA: Diagnosis not present

## 2021-04-26 DIAGNOSIS — K610 Anal abscess: Secondary | ICD-10-CM | POA: Diagnosis not present

## 2021-04-26 DIAGNOSIS — K6289 Other specified diseases of anus and rectum: Secondary | ICD-10-CM | POA: Diagnosis not present

## 2021-04-28 DIAGNOSIS — K610 Anal abscess: Secondary | ICD-10-CM | POA: Diagnosis not present

## 2021-04-28 DIAGNOSIS — K611 Rectal abscess: Secondary | ICD-10-CM | POA: Diagnosis not present

## 2021-05-03 ENCOUNTER — Encounter (HOSPITAL_COMMUNITY): Payer: Self-pay

## 2021-05-03 ENCOUNTER — Other Ambulatory Visit: Payer: Self-pay

## 2021-05-03 ENCOUNTER — Emergency Department (HOSPITAL_COMMUNITY)
Admission: EM | Admit: 2021-05-03 | Discharge: 2021-05-04 | Disposition: A | Discharge status: Home / Self Care | Payer: BC Managed Care – PPO | Attending: Emergency Medicine | Admitting: Emergency Medicine

## 2021-05-03 DIAGNOSIS — K610 Anal abscess: Secondary | ICD-10-CM | POA: Insufficient documentation

## 2021-05-03 DIAGNOSIS — K6289 Other specified diseases of anus and rectum: Secondary | ICD-10-CM | POA: Diagnosis not present

## 2021-05-03 LAB — BASIC METABOLIC PANEL
Anion gap: 11 (ref 5–15)
BUN: 26 mg/dL — ABNORMAL HIGH (ref 6–20)
CO2: 21 mmol/L — ABNORMAL LOW (ref 22–32)
Calcium: 10 mg/dL (ref 8.9–10.3)
Chloride: 102 mmol/L (ref 98–111)
Creatinine, Ser: 1.79 mg/dL — ABNORMAL HIGH (ref 0.61–1.24)
GFR, Estimated: 51 mL/min — ABNORMAL LOW (ref >60)
Glucose, Bld: 131 mg/dL — ABNORMAL HIGH (ref 70–99)
Potassium: 3.9 mmol/L (ref 3.5–5.1)
Sodium: 134 mmol/L — ABNORMAL LOW (ref 135–145)

## 2021-05-03 LAB — URINALYSIS, ROUTINE W REFLEX MICROSCOPIC
Bacteria, UA: NONE SEEN
Bilirubin Urine: NEGATIVE
Glucose, UA: NEGATIVE mg/dL
Hgb urine dipstick: NEGATIVE
Ketones, ur: 20 mg/dL — AB
Leukocytes,Ua: NEGATIVE
Nitrite: NEGATIVE
Protein, ur: 30 mg/dL — AB
Specific Gravity, Urine: 1.041 — ABNORMAL HIGH (ref 1.005–1.030)
pH: 5 (ref 5.0–8.0)

## 2021-05-03 LAB — CBC WITH DIFFERENTIAL/PLATELET
Abs Immature Granulocytes: 0.01 10*3/uL (ref 0.00–0.07)
Basophils Absolute: 0 10*3/uL (ref 0.0–0.1)
Basophils Relative: 1 %
Eosinophils Absolute: 0 10*3/uL (ref 0.0–0.5)
Eosinophils Relative: 0 %
HCT: 45.5 % (ref 39.0–52.0)
Hemoglobin: 15.1 g/dL (ref 13.0–17.0)
Immature Granulocytes: 0 %
Lymphocytes Relative: 28 %
Lymphs Abs: 2.1 10*3/uL (ref 0.7–4.0)
MCH: 29.7 pg (ref 26.0–34.0)
MCHC: 33.2 g/dL (ref 30.0–36.0)
MCV: 89.6 fL (ref 80.0–100.0)
Monocytes Absolute: 0.4 10*3/uL (ref 0.1–1.0)
Monocytes Relative: 5 %
Neutro Abs: 4.9 10*3/uL (ref 1.7–7.7)
Neutrophils Relative %: 66 %
Platelets: 330 10*3/uL (ref 150–400)
RBC: 5.08 MIL/uL (ref 4.22–5.81)
RDW: 12.5 % (ref 11.5–15.5)
WBC: 7.4 10*3/uL (ref 4.0–10.5)
nRBC: 0 % (ref 0.0–0.2)

## 2021-05-03 NOTE — ED Provider Triage Note (Signed)
Emergency Medicine Provider Triage Evaluation Note ? ?Robert Giles , a 31 y.o. male  was evaluated in triage.  Pt complains of perirectal abscess.  Abscess has been present for the last 2 weeks.  Patient has been seen in the emergency department multiple times for this issue.  Most recently patient was seen on 4/24.  Patient reports that he had continued drainage to the site.  Denies any worsening of pain.  ? ?Patient also states that his urine has been very dark in color.  States that he was told that medication was on was causing him to have kidney issues.  Patient is unsure what medication this was. ? ?Review of Systems  ?Positive: Perirectal abscess ?Negative: Dysuria, hematuria, urinary urgency ? ?Physical Exam  ?BP (!) 121/108 (BP Location: Right Arm)   Pulse (!) 116   Temp 98.7 ?F (37.1 ?C) (Oral)   Resp 18   Ht 6' (1.829 m)   Wt 108.9 kg   SpO2 93%   BMI 32.55 kg/m?  ?Gen:   Awake, no distress   ?Resp:  Normal effort  ?MSK:   Moves extremities without difficulty  ?Other:   ? ?Medical Decision Making  ?Medically screening exam initiated at 2:48 PM.  Appropriate orders placed.  Robert Giles was informed that the remainder of the evaluation will be completed by another provider, this initial triage assessment does not replace that evaluation, and the importance of remaining in the ED until their evaluation is complete. ? ?Per chart review patient had CT imaging on 4/24 which showed "Redemonstrated right perianal abscess measuring approximately 2.6 x 2.7 x 3.0" we will hold any CT imaging at this time. ? ?Previous labs showed creatinine slightly elevated 1.37.  We will recheck basic labs and urinalysis. ?  ?Loni Beckwith, PA-C ?05/03/21 1449 ? ?

## 2021-05-03 NOTE — ED Triage Notes (Signed)
Patient complains of abscess near rectum on left side that is draining.  ?

## 2021-05-04 ENCOUNTER — Emergency Department (HOSPITAL_COMMUNITY): Payer: BC Managed Care – PPO

## 2021-05-04 DIAGNOSIS — K6289 Other specified diseases of anus and rectum: Secondary | ICD-10-CM | POA: Diagnosis not present

## 2021-05-04 MED ORDER — SODIUM CHLORIDE 0.9 % IV BOLUS
1000.0000 mL | Freq: Once | INTRAVENOUS | Status: AC
  Administered 2021-05-04: 1000 mL via INTRAVENOUS

## 2021-05-04 MED ORDER — HYDROMORPHONE HCL 1 MG/ML IJ SOLN
1.0000 mg | Freq: Once | INTRAMUSCULAR | Status: AC
  Administered 2021-05-04: 1 mg via INTRAVENOUS
  Filled 2021-05-04: qty 1

## 2021-05-04 MED ORDER — ONDANSETRON HCL 4 MG PO TABS: 4.0000 mg | ORAL_TABLET | Freq: Four times a day (QID) | ORAL | 0 refills | Status: DC | PRN

## 2021-05-04 MED ORDER — ONDANSETRON HCL 4 MG/2ML IJ SOLN
4.0000 mg | Freq: Once | INTRAMUSCULAR | Status: AC
  Administered 2021-05-04: 4 mg via INTRAVENOUS
  Filled 2021-05-04: qty 2

## 2021-05-04 NOTE — Discharge Instructions (Addendum)
You need to follow-up with general surgery to ensure that this area heals.  Please schedule an outpatient visit.  Your kidneys were also abnormal during your visit today, please schedule follow-up with [facility] for a recheck of your kidney function. ? ?TAKE THE AUGMENTIN ANTIBIOTIC THAT WAS PREVIOUSLY PRESCRIBED. TAKE WITH FOOD AND USE ZOFRAN IF NEEDED FOR NAUSEA. ?

## 2021-05-04 NOTE — ED Provider Notes (Signed)
?[facility] EMERGENCY DEPARTMENT ?Provider Note ? ? ?CSN: [id number] ?Arrival date & time: 05/03/21  1428 ? ?  ? ?History ? ?Chief Complaint  ?Patient presents with  ? Abscess  ? ? ?Robert Giles is a 31 y.o. male. ? ?Patient presents to the emergency department for evaluation of drainage from his perianal area.  Patient reports that he first noticed pain and swelling and then the area started to drain.  Drainage has been present for more than a week.  Patient reporting pain. ? ? ?  ? ?Home Medications ?Prior to Admission medications   ?Medication Sig Start Date End Date Taking? Authorizing Provider  ?amoxicillin-clavulanate (AUGMENTIN) 875-125 MG tablet Take 1 tablet by mouth See admin instructions. Bid x 10 days 04/26/21  Yes [provider]  ?ibuprofen (ADVIL) 200 MG tablet Take 200 mg by mouth every 6 (six) hours as needed for moderate pain.   Yes [provider]  ?   ? ?Allergies    ?Patient has no known allergies.   ? ?Review of Systems   ?Review of Systems ? ?Physical Exam ?Updated Vital Signs ?BP (!) 143/99   Pulse 89   Temp 98.1 ?F (36.7 ?C) (Oral)   Resp 17   Ht 6' (1.829 m)   Wt 108.9 kg   SpO2 99%   BMI 32.55 kg/m?  ?Physical Exam ? ?ED Results / Procedures / Treatments   ?Labs ?(all labs ordered are listed, but only abnormal results are displayed) ?Labs Reviewed  ?BASIC METABOLIC PANEL - Abnormal; Notable for the following components:  ?    Result Value  ? Sodium 134 (*)   ? CO2 21 (*)   ? Glucose, Bld 131 (*)   ? BUN 26 (*)   ? Creatinine, Ser 1.79 (*)   ? GFR, Estimated 51 (*)   ? All other components within normal limits  ?URINALYSIS, ROUTINE W REFLEX MICROSCOPIC - Abnormal; Notable for the following components:  ? Color, Urine AMBER (*)   ? APPearance HAZY (*)   ? Specific Gravity, Urine 1.041 (*)   ? Ketones, ur 20 (*)   ? Protein, ur 30 (*)   ? All other components within normal limits  ?CBC WITH DIFFERENTIAL/PLATELET  ? ? ?EKG ?None ? ?Radiology ?CT  ABDOMEN PELVIS WO CONTRAST ? ?Result Date: 05/04/2021 ?CLINICAL DATA:  Rectal pain, drainage, possible perirectal abscess EXAM: CT ABDOMEN AND PELVIS WITHOUT CONTRAST TECHNIQUE: Multidetector CT imaging of the abdomen and pelvis was performed following the standard protocol without IV contrast. RADIATION DOSE REDUCTION: This exam was performed according to the departmental dose-optimization program which includes automated exposure control, adjustment of the mA and/or kV according to patient size and/or use of iterative reconstruction technique. COMPARISON:  None. FINDINGS: Lower chest: No acute findings Hepatobiliary: No focal hepatic abnormality. Gallbladder unremarkable. Pancreas: No focal abnormality or ductal dilatation. Spleen: No focal abnormality.  Normal size. Adrenals/Urinary Tract: No adrenal abnormality. No focal renal abnormality. No stones or hydronephrosis. Urinary bladder is unremarkable. Stomach/Bowel: Stomach, large and small bowel grossly unremarkable. Normal appendix. Small amount of gas in the soft tissues to the left of the anus. No fluid seen. Vascular/Lymphatic: No evidence of aneurysm or adenopathy. Reproductive: No visible focal abnormality. Other: No free fluid or free air. Musculoskeletal: No acute bony abnormality. IMPRESSION: Linear collection of gas in the soft tissues to the left of the anus could reflect perianal abscess. No visible drainable fluid. Otherwise no acute findings in the abdomen or pelvis. Electronically  Signed   By: Charlett Nose M.D.   On: 05/04/2021 02:15   ? ?Procedures ?Procedures  ? ? ?Medications Ordered in ED ?Medications  ?sodium chloride 0.9 % bolus 1,000 mL (1,000 mLs Intravenous New Bag/Given 05/04/21 0248)  ?ondansetron [facility]) injection 4 mg (4 mg Intravenous Given 05/04/21 0252)  ?HYDROmorphone (DILAUDID) injection 1 mg (1 mg Intravenous Given 05/04/21 0253)  ? ? ?ED Course/ Medical Decision Making/ A&P ?  ?                        ?Medical Decision  Making ?Amount and/or Complexity of Data Reviewed ?Radiology: ordered. ? ?Risk ?Prescription drug management. ? ? ?Patient presents with persistent drainage and pain in the perirectal and anal area.  Outside records obtained.  Patient seen at [facility] twice for this.  He had originally been prescribed Bactrim for possible prostatitis but CT scan at [facility] showed perianal abscess.  Patient switched to Augmentin.  He reports that he did not take the Augmentin because it made him sick.  He is now spontaneously draining. ? ?CT scan does not show any fluid collections at this time.  No deep pelvic infections. ? ?Lab work reveals elevated BUN and creatinine.  Will give IV fluids.  Referral to nephrology as his baseline was already elevated. ? ? ? ? ? ? ? ? ? ?Final Clinical Impression(s) / ED Diagnoses ?Final diagnoses:  ?Perianal abscess  ? ? ?Rx / DC Orders ?ED Discharge Orders   ? ?      Ordered  ?  Ambulatory referral to Nephrology       ? 05/04/21 0252  ? ?  ?  ? ?  ? ? ?  ?Gilda Crease, MD ?05/04/21 [id number] ? ?

## 2022-11-25 IMAGING — CT CT ABD-PELV W/O CM
2 of 4 series · 17 of 46 positions shown, 19 images · non-contrast
Comparison: None.

CLINICAL DATA: Rectal pain, drainage, possible perirectal abscess



[Series 3: ap without · axial · non-contrast · 0.77mm/px · z∈[+766,+1222]mm · 14 of 101 slices shown, 16 images]
[im 5/101  soft-tissue]
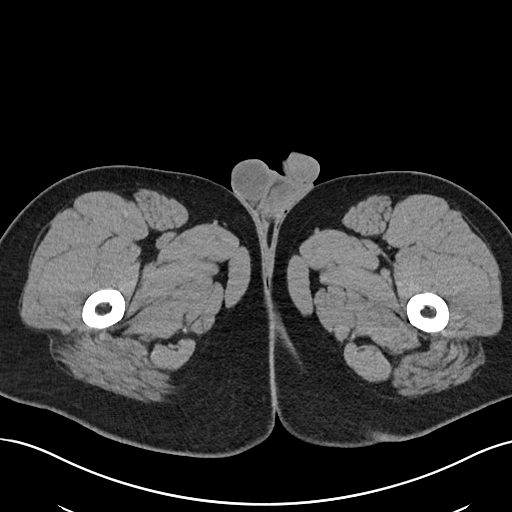
[im 5/101  bone]
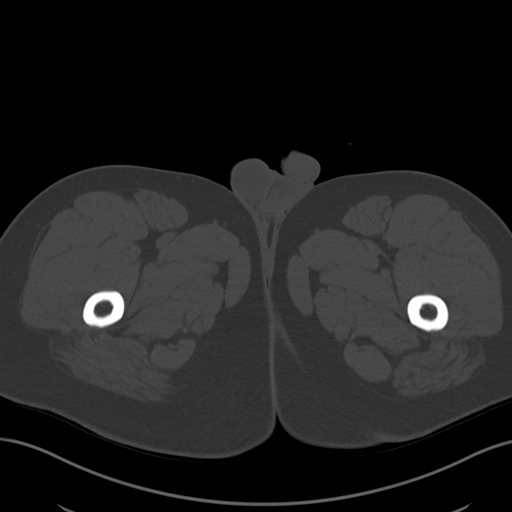
[im 15/101  soft-tissue]
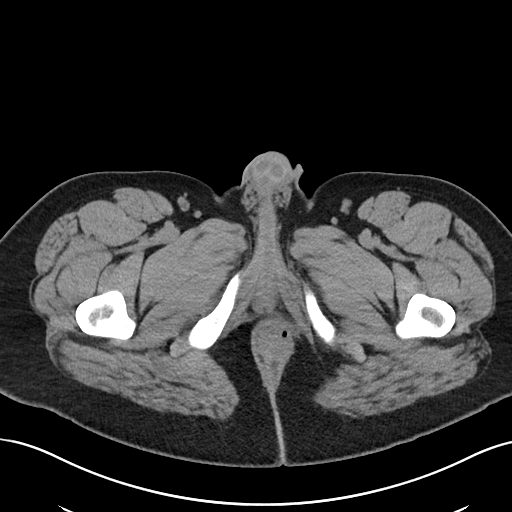
[im 20/101  soft-tissue]
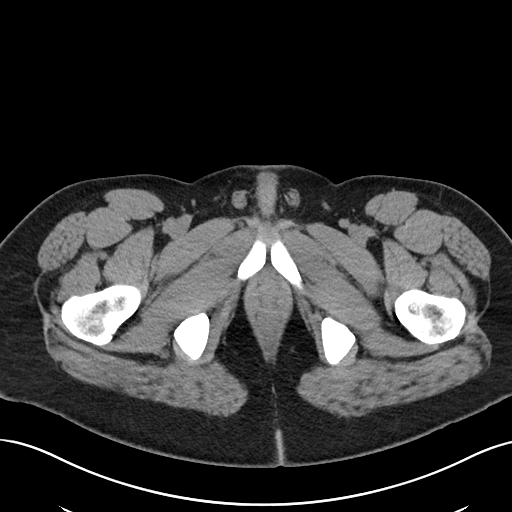
[im 29/101  soft-tissue]
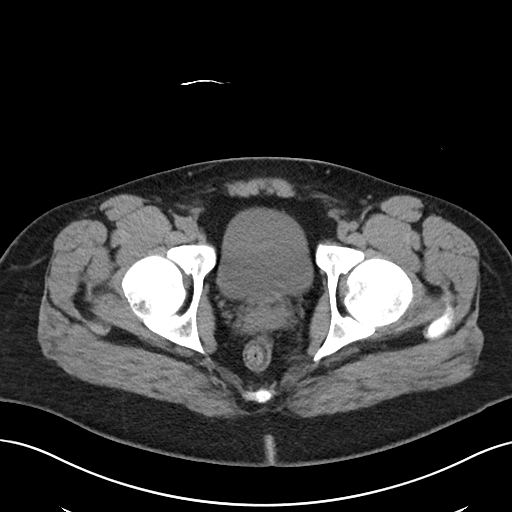
[im 34/101  soft-tissue]
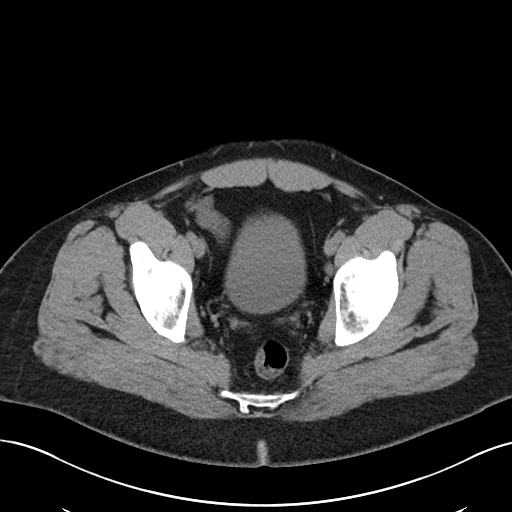
[im 39/101  soft-tissue]
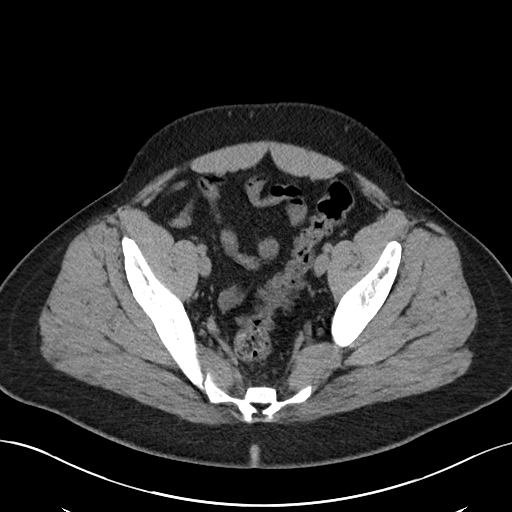
[im 48/101  soft-tissue]
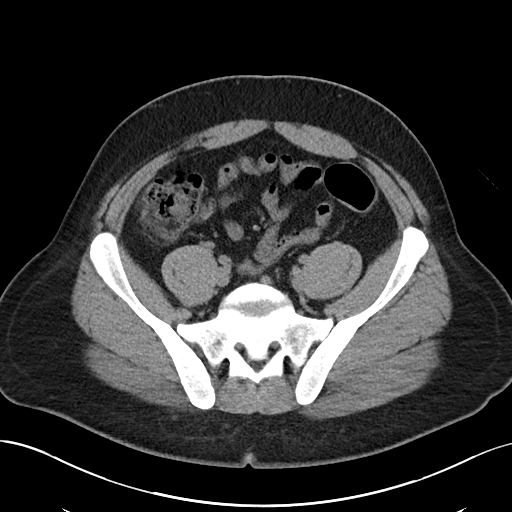
[im 53/101  soft-tissue]
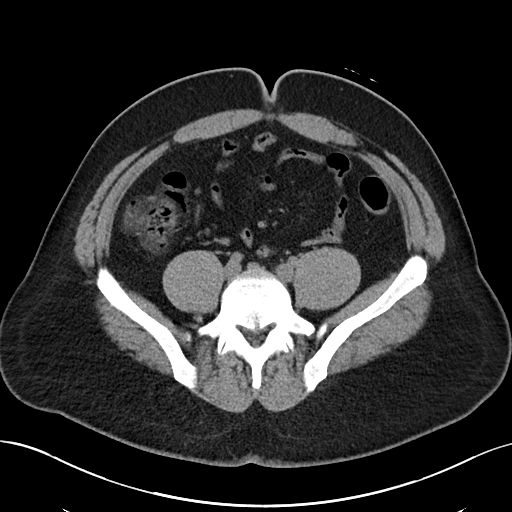
[im 62/101  soft-tissue]
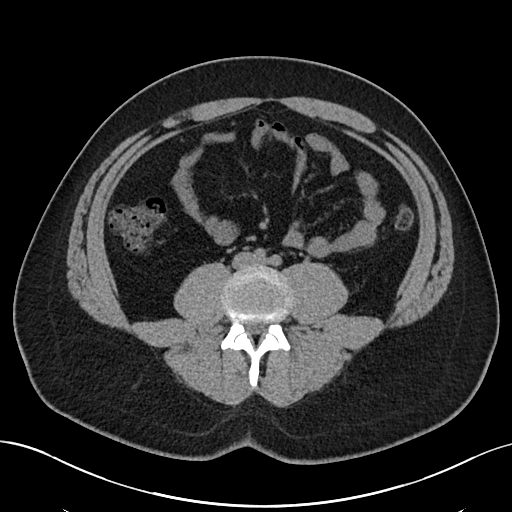
[im 62/101  bone]
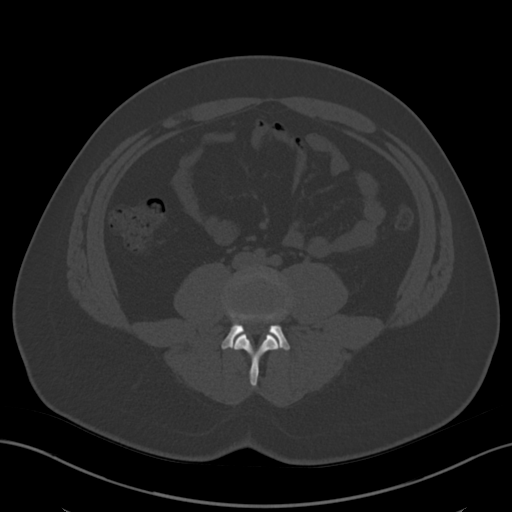
[im 67/101  soft-tissue]
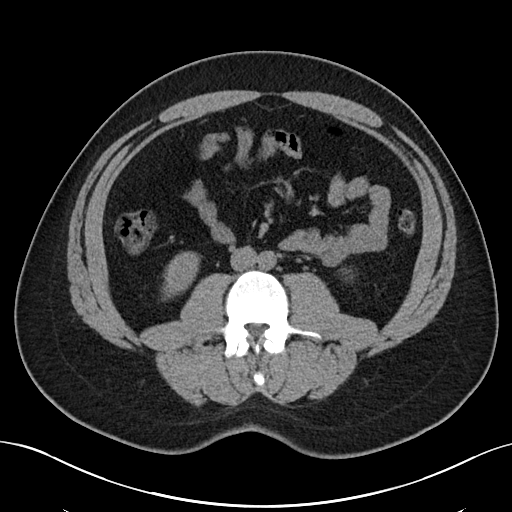
[im 77/101  soft-tissue]
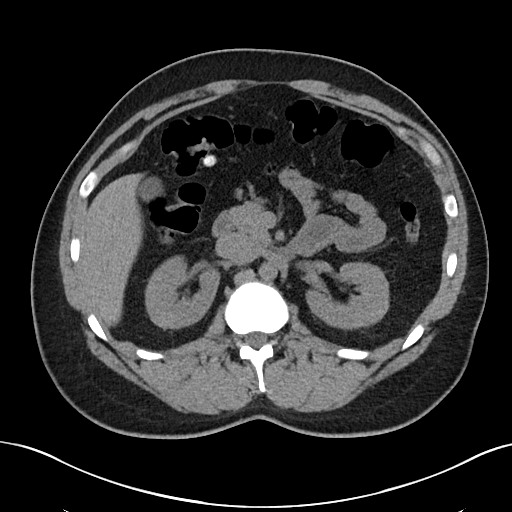
[im 81/101  soft-tissue]
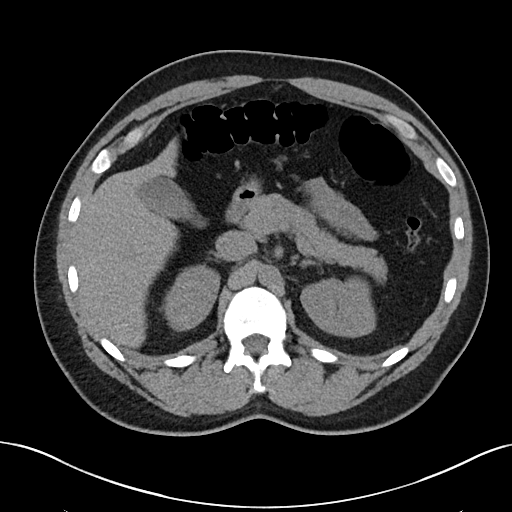
[im 86/101  soft-tissue]
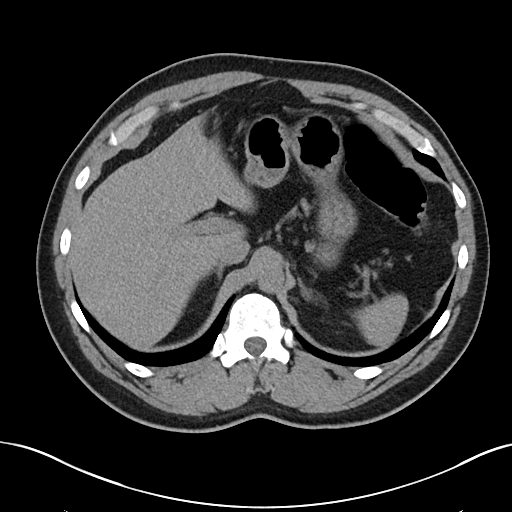
[im 96/101  soft-tissue]
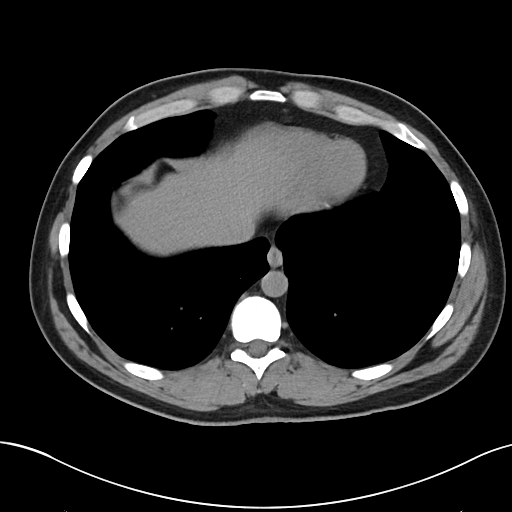

[Series 6: cor · coronal · 0.90mm/px · 3 of 110 slices shown]
[im 37/110  soft-tissue]
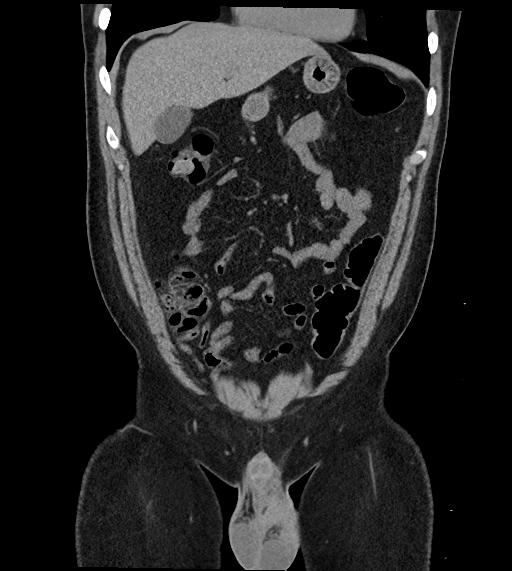
[im 49/110  soft-tissue]
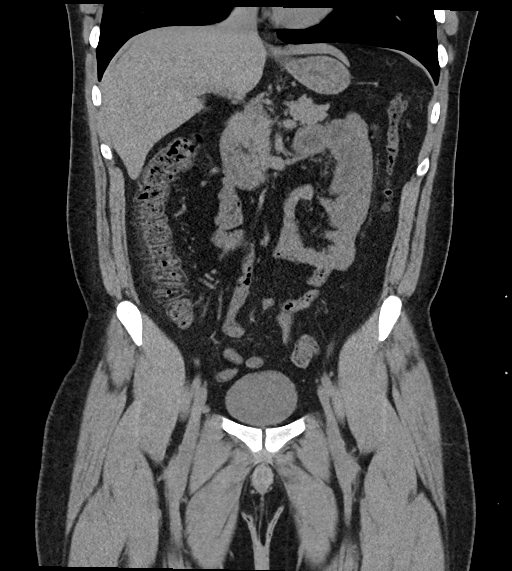
[im 61/110  soft-tissue]
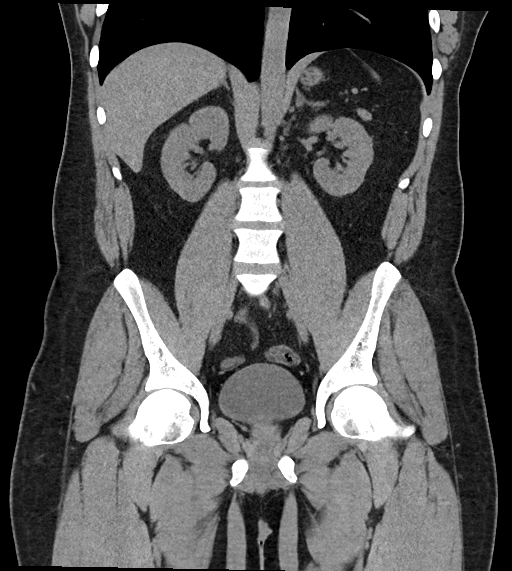

[17 of 46 positions shown; findings below may reference images not displayed]

FINDINGS: Lower chest: No acute findings

Hepatobiliary: No focal hepatic abnormality. Gallbladder
unremarkable.

Pancreas: No focal abnormality or ductal dilatation.

Spleen: No focal abnormality.  Normal size.

Adrenals/Urinary Tract: No adrenal abnormality. No focal renal
abnormality. No stones or hydronephrosis. Urinary bladder is
unremarkable.

Stomach/Bowel: Stomach, large and small bowel grossly unremarkable.
Normal appendix. Small amount of gas in the soft tissues to the left
of the anus. No fluid seen.

Vascular/Lymphatic: No evidence of aneurysm or adenopathy.

Reproductive: No visible focal abnormality.

Other: No free fluid or free air.

Musculoskeletal: No acute bony abnormality.
IMPRESSION: Linear collection of gas in the soft tissues to the left of the anus
could reflect perianal abscess. No visible drainable fluid.

Otherwise no acute findings in the abdomen or pelvis.

## 2023-03-30 ENCOUNTER — Other Ambulatory Visit: Payer: Self-pay

## 2023-03-30 ENCOUNTER — Emergency Department (HOSPITAL_COMMUNITY): Payer: Self-pay

## 2023-03-30 ENCOUNTER — Emergency Department (HOSPITAL_COMMUNITY)
Admission: EM | Admit: 2023-03-30 | Discharge: 2023-03-30 | Disposition: A | Discharge status: Home / Self Care | Payer: Self-pay | Attending: Emergency Medicine | Admitting: Emergency Medicine

## 2023-03-30 DIAGNOSIS — S61412A Laceration without foreign body of left hand, initial encounter: Secondary | ICD-10-CM | POA: Insufficient documentation

## 2023-03-30 DIAGNOSIS — Z2914 Encounter for prophylactic rabies immune globin: Secondary | ICD-10-CM | POA: Insufficient documentation

## 2023-03-30 DIAGNOSIS — S61451A Open bite of right hand, initial encounter: Secondary | ICD-10-CM | POA: Insufficient documentation

## 2023-03-30 DIAGNOSIS — Z203 Contact with and (suspected) exposure to rabies: Secondary | ICD-10-CM | POA: Insufficient documentation

## 2023-03-30 DIAGNOSIS — Z23 Encounter for immunization: Secondary | ICD-10-CM | POA: Insufficient documentation

## 2023-03-30 DIAGNOSIS — W540XXA Bitten by dog, initial encounter: Secondary | ICD-10-CM | POA: Insufficient documentation

## 2023-03-30 MED ORDER — HYDROMORPHONE HCL 1 MG/ML IJ SOLN
1.0000 mg | Freq: Once | INTRAMUSCULAR | Status: AC
Start: 2023-03-30 — End: 2023-03-30
  Administered 2023-03-30: 1 mg via INTRAMUSCULAR
  Filled 2023-03-30: qty 1

## 2023-03-30 MED ORDER — LIDOCAINE-EPINEPHRINE (PF) 2 %-1:200000 IJ SOLN
20.0000 mL | Freq: Once | INTRAMUSCULAR | Status: AC
Start: 2023-03-30 — End: 2023-03-30
  Administered 2023-03-30: 20 mL
  Filled 2023-03-30: qty 20

## 2023-03-30 MED ORDER — AMOXICILLIN-POT CLAVULANATE 875-125 MG PO TABS
1.0000 | ORAL_TABLET | Freq: Once | ORAL | Status: AC
  Administered 2023-03-30: 1 via ORAL
  Filled 2023-03-30: qty 1

## 2023-03-30 MED ORDER — RABIES IMMUNE GLOBULIN 150 UNIT/ML IM INJ
20.0000 [IU]/kg | INJECTION | Freq: Once | INTRAMUSCULAR | Status: AC
  Administered 2023-03-30: 2025 [IU] via INTRAMUSCULAR
  Filled 2023-03-30 (×2): qty 14

## 2023-03-30 MED ORDER — AMOXICILLIN-POT CLAVULANATE 875-125 MG PO TABS: 1.0000 | ORAL_TABLET | Freq: Two times a day (BID) | ORAL | 0 refills | Status: AC

## 2023-03-30 MED ORDER — RABIES VACCINE, PCEC IM SUSR
1.0000 mL | Freq: Once | INTRAMUSCULAR | Status: AC
  Administered 2023-03-30: 1 mL via INTRAMUSCULAR
  Filled 2023-03-30: qty 1

## 2023-03-30 NOTE — ED Provider Notes (Signed)
 [place] [facility] AT [facility] Provider Note   CSN: [id number] Arrival date & time: 03/30/23  1478     History  Chief Complaint  Patient presents with  . Animal Bite    Robert Giles is a 33 y.o. male.  Pt indicates was drinking last night, and was bitten by unknown dog on right forearm and bilateral hands. Denies knowing dog or whether dog is vaccinated. Tetanus is up to date. No hand/finger numbness/weakness. Denies other pain or injury.   The history is provided by the patient and medical records.  Animal Bite Associated symptoms: no fever and no numbness        Home Medications Prior to Admission medications   Medication Sig Start Date End Date Taking? Authorizing Provider  amoxicillin-clavulanate (AUGMENTIN) 875-125 MG tablet Take 1 tablet by mouth every 12 (twelve) hours. 03/30/23  Yes Cathren Laine, MD  ibuprofen (ADVIL) 200 MG tablet Take 200 mg by mouth every 6 (six) hours as needed for moderate pain.    [provider]  ondansetron (ZOFRAN) 4 MG tablet Take 1 tablet (4 mg total) by mouth every 6 (six) hours as needed for nausea or vomiting. 05/04/21   Pollina, Canary Brim, MD      Allergies    Patient has no known allergies.    Review of Systems   Review of Systems  Constitutional:  Negative for fever.  Respiratory:  Negative for shortness of breath.   Cardiovascular:  Negative for chest pain.  Gastrointestinal:  Negative for abdominal pain.  Genitourinary:  Negative for flank pain.  Musculoskeletal:  Negative for back pain and neck pain.  Skin:  Positive for wound.  Neurological:  Negative for weakness, numbness and headaches.    Physical Exam Updated Vital Signs BP 120/69   Pulse 83   Temp 98.8 F (37.1 C)   Resp 17   Ht 1.829 m (6')   Wt 102.1 kg   SpO2 94%   BMI 30.52 kg/m  Physical Exam Vitals and nursing note reviewed.  Constitutional:      Appearance: Normal appearance. He is well-developed.   HENT:     Head: Atraumatic.     Nose: Nose normal.     Mouth/Throat:     Mouth: Mucous membranes are moist.  Eyes:     General: No scleral icterus.    Conjunctiva/sclera: Conjunctivae normal.     Pupils: Pupils are equal, round, and reactive to light.  Neck:     Trachea: No tracheal deviation.  Cardiovascular:     Rate and Rhythm: Normal rate and regular rhythm.     Pulses: Normal pulses.     Heart sounds: Normal heart sounds. No murmur heard.    No friction rub. No gallop.  Pulmonary:     Effort: Pulmonary effort is normal. No accessory muscle usage or respiratory distress.     Breath sounds: Normal breath sounds.  Abdominal:     General: There is no distension.     Palpations: Abdomen is soft.     Tenderness: There is no abdominal tenderness.  Musculoskeletal:        General: No swelling.     Cervical back: Normal range of motion and neck supple. No rigidity.     Comments: Bite marks/wounds to right forearm, right hand and left hand. No fbs seen or felt. No bone/tendon exposure, normal flexor/extensor tendon fxn bil hands. Radial pulse 2+. Normal cap refill distally in fingers. No focal bony tenderness. Compartments  of forearms and hands soft, not tense, no significant sts noted.   Skin:    General: Skin is warm and dry.     Findings: No rash.  Neurological:     Mental Status: He is alert.     Comments: Alert, speech clear. Motor/sens grossly intact bil. Bil hands nvi.   Psychiatric:        Mood and Affect: Mood normal.    ED Results / Procedures / Treatments   Labs (all labs ordered are listed, but only abnormal results are displayed) Labs Reviewed - No data to display  EKG None  Radiology DG Hand Complete Left Result Date: 03/30/2023 CLINICAL DATA:  Dog bite.  Evaluate for foreign body. EXAM: LEFT HAND - COMPLETE 3+ VIEW COMPARISON:  None Available. FINDINGS: Soft tissue injury along the tip of the second digit. No underlying osseous abnormality or radiopaque  foreign body. And is otherwise unremarkable. IMPRESSION: Soft tissue injury along the tip of the second digit without underlying osseous abnormality or radiopaque foreign body. Electronically Signed   By: Leanna Battles M.D.   On: 03/30/2023 12:27    Procedures .Laceration Repair  Date/Time: 03/30/2023 11:40 AM  Performed by: Cathren Laine, MD Authorized by: Cathren Laine, MD   Consent:    Consent given by:  Patient Anesthesia:    Anesthesia method:  Local infiltration   Local anesthetic:  Lidocaine 2% w/o epi Laceration details:    Location:  Hand   Hand location:  L hand, dorsum   Length (cm):  3.5 Pre-procedure details:    Preparation:  Patient was prepped and draped in usual sterile fashion and imaging obtained to evaluate for foreign bodies Exploration:    Imaging outcome: foreign body not noted     Wound extent: no foreign body   Treatment:    Area cleansed with:  Saline   Amount of cleaning:  Extensive   Irrigation solution:  Sterile saline   Irrigation volume:  500 cc   Visualized foreign bodies/material removed: no   Skin repair:    Repair method:  Sutures   Suture size:  4-0   Suture material:  Prolene   Suture technique:  Simple interrupted   Number of sutures:  4 Repair type:    Repair type:  Intermediate Post-procedure details:    Dressing:  Sterile dressing   Procedure completion:  Tolerated well, no immediate complications     Medications Ordered in ED Medications  amoxicillin-clavulanate (AUGMENTIN) 875-125 MG per tablet 1 tablet (1 tablet Oral Given 03/30/23 0753)  rabies immune globulin (HYPERRAB/KEDRAB) injection 2,025 Units (2,025 Units Intramuscular Given 03/30/23 0753)  rabies vaccine (RABAVERT) injection 1 mL (1 mL Intramuscular Given 03/30/23 0755)  HYDROmorphone (DILAUDID) injection 1 mg (1 mg Intramuscular Given 03/30/23 0756)  lidocaine-EPINEPHrine (XYLOCAINE W/EPI) 2 %-1:200000 (PF) injection 20 mL (20 mLs Infiltration Given 03/30/23 1200)     ED Course/ Medical Decision Making/ A&P                                 Medical Decision Making Problems Addressed: Dog bite of multiple sites: acute illness or injury Laceration of left hand without foreign body, initial encounter: acute illness or injury  Amount and/or Complexity of Data Reviewed External Data Reviewed: notes. Radiology: ordered and independent interpretation performed. Decision-making details documented in ED Course.  Risk Prescription drug management.   Reviewed nursing notes and prior charts for additional history.  Tetanus is up to date.   Dilaudid 1 mg IM.  Augmentin po. Rx for home.   Rabies vaccine and immune globulin. Wounds cleaned/irrigated well. Sterile dressings applied.   Xrays reviewed/interpreted by me - no fx, no fb.   Pt appears stable for d/c.            Final Clinical Impression(s) / ED Diagnoses Final diagnoses:  Dog bite of multiple sites  Laceration of left hand without foreign body, initial encounter    Rx / DC Orders ED Discharge Orders          Ordered    amoxicillin-clavulanate (AUGMENTIN) 875-125 MG tablet  Every 12 hours        03/30/23 1142              Cathren Laine, MD 03/30/23 1237

## 2023-03-30 NOTE — Discharge Instructions (Addendum)
 It was our pleasure to provide your ER care today - we hope that you feel better.  Keep wound very clean and dry. Take augmentin (antibiotic) as prescribed.   Have sutures removed, your doctor or urgent care, in 10-12 days.   Return to ER if worse, new symptoms, infection of wound (pus, spreading redness, worsening pain/swelling), severe pain, or other concern.

## 2023-03-30 NOTE — ED Notes (Signed)
 Wounds dressed with sterile dressing.

## 2023-03-30 NOTE — ED Triage Notes (Signed)
 Patient states he got bit by a dog this morning. Has bite on left hand and right  arm. Bleeding under control on right hand. Patient states drinking tonight and was going home. Unknown dog

## 2023-04-08 ENCOUNTER — Ambulatory Visit (HOSPITAL_COMMUNITY)
Admission: EM | Admit: 2023-04-08 | Discharge: 2023-04-08 | Disposition: A | Discharge status: Home / Self Care | Payer: Self-pay | Attending: Internal Medicine | Admitting: Internal Medicine

## 2023-04-08 ENCOUNTER — Encounter (HOSPITAL_COMMUNITY): Payer: Self-pay

## 2023-04-08 DIAGNOSIS — Z23 Encounter for immunization: Secondary | ICD-10-CM

## 2023-04-08 DIAGNOSIS — Z4802 Encounter for removal of sutures: Secondary | ICD-10-CM

## 2023-04-08 MED ORDER — RABIES VACCINE, PCEC IM SUSR
INTRAMUSCULAR | Status: AC
  Filled 2023-04-08: qty 1

## 2023-04-08 MED ORDER — BACITRACIN ZINC 500 UNIT/GM EX OINT
TOPICAL_OINTMENT | CUTANEOUS | Status: AC
  Filled 2023-04-08: qty 0.9

## 2023-04-08 MED ORDER — RABIES VACCINE, PCEC IM SUSR
1.0000 mL | Freq: Once | INTRAMUSCULAR | Status: AC
  Administered 2023-04-08: 1 mL via INTRAMUSCULAR

## 2023-04-08 NOTE — ED Provider Notes (Signed)
 [facility]    CSN: [id number] Arrival date & time: 04/08/23  1529      History   Chief Complaint Chief Complaint  Patient presents with   Suture / Staple Removal    HPI Robert Giles is a 33 y.o. male.   33 year old male who presents urgent care for suture removal after a dog bite.  He was seen and evaluated at the emergency room last Saturday, 3/22.  He had sutures placed on the dorsum of his left hand.  He was started on the rabies series but has not had any shots since then.  He denies any fevers or chills.  His tetanus was up-to-date at that time.   Suture / Staple Removal Pertinent negatives include no chest pain, no abdominal pain and no shortness of breath.    History reviewed. No pertinent past medical history.  There are no active problems to display for this patient.   History reviewed. No pertinent surgical history.     Home Medications    Prior to Admission medications   Medication Sig Start Date End Date Taking? Authorizing Provider  amoxicillin-clavulanate (AUGMENTIN) 875-125 MG tablet Take 1 tablet by mouth every 12 (twelve) hours. 03/30/23   Cathren Laine, MD  ibuprofen (ADVIL) 200 MG tablet Take 200 mg by mouth every 6 (six) hours as needed for moderate pain.    [provider]  ondansetron (ZOFRAN) 4 MG tablet Take 1 tablet (4 mg total) by mouth every 6 (six) hours as needed for nausea or vomiting. 05/04/21   Pollina, Canary Brim, MD    Family History Family History  Problem Relation Age of Onset   Heart failure Mother     Social History Social History   Tobacco Use   Smoking status: Every Day    Types: Cigarettes   Smokeless tobacco: Never  Vaping Use   Vaping status: Some Days  Substance Use Topics   Alcohol use: Not Currently   Drug use: Yes    Types: Marijuana     Allergies   Patient has no known allergies.   Review of Systems Review of Systems  Constitutional:  Negative for chills and fever.   HENT:  Negative for ear pain and sore throat.   Eyes:  Negative for pain and visual disturbance.  Respiratory:  Negative for cough and shortness of breath.   Cardiovascular:  Negative for chest pain and palpitations.  Gastrointestinal:  Negative for abdominal pain and vomiting.  Genitourinary:  Negative for dysuria and hematuria.  Musculoskeletal:  Negative for arthralgias and back pain.  Skin:  Negative for color change and rash.       Sutures left hand dorsal  Neurological:  Negative for seizures and syncope.  All other systems reviewed and are negative.    Physical Exam Triage Vital Signs ED Triage Vitals [04/08/23 1707]  Encounter Vitals Group     BP 121/84     Systolic BP Percentile      Diastolic BP Percentile      Pulse Rate 87     Resp 18     Temp 98.7 F (37.1 C)     Temp Source Oral     SpO2 97 %     Weight      Height      Head Circumference      Peak Flow      Pain Score      Pain Loc      Pain Education  Exclude from Growth Chart    No data found.  Updated Vital Signs BP 121/84 (BP Location: Left Arm)   Pulse 87   Temp 98.7 F (37.1 C) (Oral)   Resp 18   SpO2 97%   Visual Acuity Right Eye Distance:   Left Eye Distance:   Bilateral Distance:    Right Eye Near:   Left Eye Near:    Bilateral Near:     Physical Exam Vitals and nursing note reviewed.  Constitutional:      General: He is not in acute distress.    Appearance: He is well-developed.  HENT:     Head: Normocephalic and atraumatic.  Eyes:     Conjunctiva/sclera: Conjunctivae normal.  Cardiovascular:     Rate and Rhythm: Normal rate and regular rhythm.  Pulmonary:     Effort: Pulmonary effort is normal. No respiratory distress.  Musculoskeletal:        General: No swelling.     Cervical back: Neck supple.  Skin:    General: Skin is warm and dry.     Capillary Refill: Capillary refill takes less than 2 seconds.     Comments: Left hand with sutures removed, incision is  c/d/i  Neurological:     Mental Status: He is alert.  Psychiatric:        Mood and Affect: Mood normal.     UC Treatments / Results  Labs (all labs ordered are listed, but only abnormal results are displayed) Labs Reviewed - No data to display  EKG   Radiology No results found.  Procedures Procedures (including critical care time)  Medications Ordered in UC Medications - No data to display  Initial Impression / Assessment and Plan / UC Course  I have reviewed the triage vital signs and the nursing notes.  Pertinent labs & imaging results that were available during my care of the patient were reviewed by me and considered in my medical decision making (see chart for details).     Visit for suture removal  Need for prophylactic vaccination against rabies    Sutures removed today.  Keep a dry dressing on the area today then may remove tomorrow and wash the area with soap and water.   Rabies vaccination started in the emergency room.  Rabies vaccine series continued today.  Next injection due as followed: Friday 4/4 Friday 4/11  You may schedule appointments for these online.  This is a nurse visit and does not require a provider visit.   Final Clinical Impressions(s) / UC Diagnoses   Final diagnoses:  Visit for suture removal  Need for prophylactic vaccination against rabies     Discharge Instructions      Sutures removed today.  Keep a dry dressing on the area today then may remove tomorrow and wash the area with soap and water.   Rabies vaccination started in the emergency room.  Rabies vaccine series continued today.  Next injection due as followed: Friday 4/4 Friday 4/11  You may schedule appointments for these online.  This is a nurse visit and does not require a provider visit.   ED Prescriptions   None    PDMP not reviewed this encounter.   Landis Martins, [place] 04/08/23 1743

## 2023-04-08 NOTE — ED Triage Notes (Signed)
 Patient is here for suture removal and to restart his rabies.

## 2023-04-08 NOTE — Discharge Instructions (Addendum)
 Sutures removed today.  Keep a dry dressing on the area today then may remove tomorrow and wash the area with soap and water.   Rabies vaccination started in the emergency room.  Rabies vaccine series continued today.  Next injection due as followed: Friday 4/4 Friday 4/11  You may schedule appointments for these online.  This is a nurse visit and does not require a provider visit.

## 2023-04-12 ENCOUNTER — Ambulatory Visit (HOSPITAL_COMMUNITY): Payer: Self-pay

## 2024-02-06 ENCOUNTER — Emergency Department (HOSPITAL_BASED_OUTPATIENT_CLINIC_OR_DEPARTMENT_OTHER)
Admission: EM | Admit: 2024-02-06 | Discharge: 2024-02-06 | Disposition: A | Discharge status: Home / Self Care | Payer: Self-pay | Attending: Emergency Medicine | Admitting: Emergency Medicine

## 2024-02-06 ENCOUNTER — Other Ambulatory Visit: Payer: Self-pay

## 2024-02-06 ENCOUNTER — Encounter (HOSPITAL_BASED_OUTPATIENT_CLINIC_OR_DEPARTMENT_OTHER): Payer: Self-pay | Admitting: Emergency Medicine

## 2024-02-06 DIAGNOSIS — R197 Diarrhea, unspecified: Secondary | ICD-10-CM | POA: Insufficient documentation

## 2024-02-06 DIAGNOSIS — R1010 Upper abdominal pain, unspecified: Secondary | ICD-10-CM | POA: Insufficient documentation

## 2024-02-06 DIAGNOSIS — R112 Nausea with vomiting, unspecified: Secondary | ICD-10-CM | POA: Insufficient documentation

## 2024-02-06 LAB — URINALYSIS, ROUTINE W REFLEX MICROSCOPIC
Bilirubin Urine: NEGATIVE
Glucose, UA: NEGATIVE mg/dL
Hgb urine dipstick: NEGATIVE
Ketones, ur: NEGATIVE mg/dL
Leukocytes,Ua: NEGATIVE
Nitrite: NEGATIVE
Protein, ur: NEGATIVE mg/dL
Specific Gravity, Urine: 1.02 (ref 1.005–1.030)
pH: 8 (ref 5.0–8.0)

## 2024-02-06 LAB — CBC WITH DIFFERENTIAL/PLATELET
Abs Immature Granulocytes: 0.02 10*3/uL (ref 0.00–0.07)
Basophils Absolute: 0 10*3/uL (ref 0.0–0.1)
Basophils Relative: 1 %
Eosinophils Absolute: 0 10*3/uL (ref 0.0–0.5)
Eosinophils Relative: 0 %
HCT: 41.1 % (ref 39.0–52.0)
Hemoglobin: 13.7 g/dL (ref 13.0–17.0)
Immature Granulocytes: 0 %
Lymphocytes Relative: 17 %
Lymphs Abs: 1.4 10*3/uL (ref 0.7–4.0)
MCH: 30 pg (ref 26.0–34.0)
MCHC: 33.3 g/dL (ref 30.0–36.0)
MCV: 90.1 fL (ref 80.0–100.0)
Monocytes Absolute: 0.4 10*3/uL (ref 0.1–1.0)
Monocytes Relative: 5 %
Neutro Abs: 6.5 10*3/uL (ref 1.7–7.7)
Neutrophils Relative %: 77 %
Platelets: 274 10*3/uL (ref 150–400)
RBC: 4.56 MIL/uL (ref 4.22–5.81)
RDW: 13.9 % (ref 11.5–15.5)
WBC: 8.4 10*3/uL (ref 4.0–10.5)
nRBC: 0 % (ref 0.0–0.2)

## 2024-02-06 LAB — COMPREHENSIVE METABOLIC PANEL WITH GFR
ALT: 17 U/L (ref 0–44)
AST: 22 U/L (ref 15–41)
Albumin: 4.4 g/dL (ref 3.5–5.0)
Alkaline Phosphatase: 97 U/L (ref 38–126)
Anion gap: 12 (ref 5–15)
BUN: 15 mg/dL (ref 6–20)
CO2: 26 mmol/L (ref 22–32)
Calcium: 9.4 mg/dL (ref 8.9–10.3)
Chloride: 105 mmol/L (ref 98–111)
Creatinine, Ser: 1.44 mg/dL — ABNORMAL HIGH (ref 0.61–1.24)
GFR, Estimated: >60 mL/min
Glucose, Bld: 93 mg/dL (ref 70–99)
Potassium: 4.2 mmol/L (ref 3.5–5.1)
Sodium: 142 mmol/L (ref 135–145)
Total Bilirubin: 0.3 mg/dL (ref 0.0–1.2)
Total Protein: 7.4 g/dL (ref 6.5–8.1)

## 2024-02-06 LAB — LIPASE, BLOOD: Lipase: 25 U/L (ref 11–51)

## 2024-02-06 MED ORDER — FAMOTIDINE 20 MG PO TABS: 20.0000 mg | ORAL_TABLET | Freq: Two times a day (BID) | ORAL | 0 refills | Status: AC

## 2024-02-06 MED ORDER — SODIUM CHLORIDE 0.9 % IV BOLUS
1000.0000 mL | Freq: Once | INTRAVENOUS | Status: AC
  Administered 2024-02-06: 1000 mL via INTRAVENOUS

## 2024-02-06 MED ORDER — ONDANSETRON 4 MG PO TBDP: 4.0000 mg | ORAL_TABLET | Freq: Three times a day (TID) | ORAL | 0 refills | Status: AC | PRN

## 2024-02-06 MED ORDER — ONDANSETRON HCL 4 MG/2ML IJ SOLN
4.0000 mg | Freq: Once | INTRAMUSCULAR | Status: AC
  Administered 2024-02-06: 4 mg via INTRAVENOUS
  Filled 2024-02-06: qty 2

## 2024-02-06 MED ORDER — PANTOPRAZOLE SODIUM 40 MG IV SOLR
40.0000 mg | Freq: Once | INTRAVENOUS | Status: AC
  Administered 2024-02-06: 40 mg via INTRAVENOUS
  Filled 2024-02-06: qty 10

## 2024-02-06 NOTE — Discharge Instructions (Addendum)
 Please read and follow all provided instructions.  Your diagnoses today include:  1. Nausea vomiting and diarrhea     Tests performed today include: Complete blood cell count: Normal white blood cell count and red blood cell count Complete metabolic panel: Your kidney function was slightly weak, but it has been stable for years Lipase (pancreas function test): Urinalysis (urine test): Vital signs. See below for your results today.   Medications prescribed:  Zofran  (ondansetron ) - for nausea and vomiting Pepcid  (famotidine ) - antihistamine  You can find this medication over-the-counter.   DO NOT exceed:  20mg  Pepcid  every 12 hours   Take any prescribed medications only as directed.  Home care instructions:  Follow any educational materials contained in this packet.  You should rest for the next several days. Keep drinking plenty of fluids and use the medicine for nausea as directed.   Drink clear liquids for the next 24 hours and introduce solid foods slowly after 24 hours using the b.r.a.t. diet (Bananas, Rice, Applesauce, Toast, Yogurt).    Follow-up instructions: Please follow-up with your primary care provider in the next 2 days for further evaluation of your symptoms. If you are not feeling better in 48 hours you may have a condition that is more serious and you need re-evaluation.   Return instructions:  SEEK IMMEDIATE MEDICAL ATTENTION IF: If you have pain that does not go away or becomes severe  A temperature above 101F develops  Repeated vomiting occurs (multiple episodes)  If you have pain that becomes localized to portions of the abdomen. The right side could possibly be appendicitis. In an adult, the left lower portion of the abdomen could be colitis or diverticulitis.  Blood is being passed in stools or vomit (bright red or black tarry stools)  You develop chest pain, difficulty breathing, dizziness or fainting, or become confused, poorly responsive, or  inconsolable (young children) If you have any other emergent concerns regarding your health  Additional Information: Abdominal (belly) pain can be caused by many things. Your caregiver performed an examination and possibly ordered blood/urine tests and imaging (CT scan, x-rays, ultrasound). Many cases can be observed and treated at home after initial evaluation in the emergency department. Even though you are being discharged home, abdominal pain can be unpredictable. Therefore, you need a repeated exam if your pain does not resolve, returns, or worsens. Most patients with abdominal pain don't have to be admitted to the hospital or have surgery, but serious problems like appendicitis and gallbladder attacks can start out as nonspecific pain. Many abdominal conditions cannot be diagnosed in one visit, so follow-up evaluations are very important.  Your vital signs today were: BP (!) 136/95   Pulse 91   Temp 97.8 F (36.6 C)   Resp (!) 23   Ht 6' (1.829 m)   Wt 99.8 kg   SpO2 100%   BMI 29.84 kg/m  If your blood pressure (bp) was elevated above 135/85 this visit, please have this repeated by your doctor within one month. --------------

## 2024-02-06 NOTE — ED Notes (Signed)
 Pt endorses acute medial abd pain after a night of ETOH use. Pt was at a celebration of life for his uncle and drank Tequila on an empty stomach. Tried to eat something and has had nausea, vomiting, and diarrhea since this morning. No recent trauma or illness otherwise. Not around anyone else that's been sick. Denied marijuana use.

## 2024-02-06 NOTE — ED Triage Notes (Signed)
"   Pt c/o emesis, diarrhea since appx 0900 today.  Denies fever.  "

## 2024-02-06 NOTE — ED Notes (Signed)
 Pt ambulating to obtain urine.
